# Patient Record
Sex: Female | Born: 1950 | Race: Black or African American | Hispanic: No | Smoking: Never smoker
Health system: Southern US, Community
[De-identification: ages and names within clinical notes are randomized; demographics above are authoritative.]

## PROBLEM LIST (undated history)

## (undated) DIAGNOSIS — E119 Type 2 diabetes mellitus without complications: Secondary | ICD-10-CM

## (undated) DIAGNOSIS — H269 Unspecified cataract: Secondary | ICD-10-CM

## (undated) DIAGNOSIS — I10 Essential (primary) hypertension: Secondary | ICD-10-CM

## (undated) DIAGNOSIS — H35039 Hypertensive retinopathy, unspecified eye: Secondary | ICD-10-CM

## (undated) DIAGNOSIS — E11319 Type 2 diabetes mellitus with unspecified diabetic retinopathy without macular edema: Secondary | ICD-10-CM

## (undated) HISTORY — DX: Hypertensive retinopathy, unspecified eye: H35.039

## (undated) HISTORY — PX: HYSTERECTOMY ABDOMINAL WITH SALPINGECTOMY: SHX6725

## (undated) HISTORY — DX: Type 2 diabetes mellitus with unspecified diabetic retinopathy without macular edema: E11.319

## (undated) HISTORY — DX: Unspecified cataract: H26.9

---

## 1999-06-14 ENCOUNTER — Encounter: Payer: Self-pay | Admitting: Obstetrics

## 1999-06-14 ENCOUNTER — Encounter: Admission: RE | Admit: 1999-06-14 | Discharge: 1999-06-14 | Payer: Self-pay | Admitting: Obstetrics

## 2000-06-14 ENCOUNTER — Encounter: Payer: Self-pay | Admitting: Obstetrics

## 2000-06-14 ENCOUNTER — Encounter: Admission: RE | Admit: 2000-06-14 | Discharge: 2000-06-14 | Payer: Self-pay | Admitting: Obstetrics

## 2001-06-16 ENCOUNTER — Encounter: Admission: RE | Admit: 2001-06-16 | Discharge: 2001-06-16 | Payer: Self-pay | Admitting: Obstetrics

## 2001-06-16 ENCOUNTER — Encounter: Payer: Self-pay | Admitting: Obstetrics

## 2001-12-10 ENCOUNTER — Encounter: Admission: RE | Admit: 2001-12-10 | Discharge: 2002-03-10 | Payer: Self-pay | Admitting: Family Medicine

## 2002-06-22 ENCOUNTER — Encounter: Payer: Self-pay | Admitting: Obstetrics

## 2002-06-22 ENCOUNTER — Encounter: Admission: RE | Admit: 2002-06-22 | Discharge: 2002-06-22 | Payer: Self-pay | Admitting: Obstetrics

## 2003-07-01 ENCOUNTER — Encounter: Admission: RE | Admit: 2003-07-01 | Discharge: 2003-07-01 | Payer: Self-pay | Admitting: Obstetrics

## 2004-08-15 ENCOUNTER — Encounter: Admission: RE | Admit: 2004-08-15 | Discharge: 2004-08-15 | Payer: Self-pay | Admitting: Obstetrics

## 2005-08-17 ENCOUNTER — Encounter: Admission: RE | Admit: 2005-08-17 | Discharge: 2005-08-17 | Payer: Self-pay | Admitting: Obstetrics

## 2006-08-19 ENCOUNTER — Encounter: Admission: RE | Admit: 2006-08-19 | Discharge: 2006-08-19 | Payer: Self-pay | Admitting: Obstetrics

## 2007-08-21 ENCOUNTER — Encounter: Admission: RE | Admit: 2007-08-21 | Discharge: 2007-08-21 | Payer: Self-pay | Admitting: Obstetrics

## 2009-01-13 ENCOUNTER — Encounter: Admission: RE | Admit: 2009-01-13 | Discharge: 2009-01-13 | Payer: Self-pay | Admitting: Obstetrics

## 2009-02-25 ENCOUNTER — Encounter: Admission: RE | Admit: 2009-02-25 | Discharge: 2009-02-25 | Payer: Self-pay | Admitting: Family Medicine

## 2010-03-02 ENCOUNTER — Encounter: Admission: RE | Admit: 2010-03-02 | Discharge: 2010-03-02 | Payer: Self-pay | Admitting: Obstetrics

## 2011-05-04 ENCOUNTER — Other Ambulatory Visit: Payer: Self-pay | Admitting: Obstetrics

## 2011-05-04 DIAGNOSIS — Z1231 Encounter for screening mammogram for malignant neoplasm of breast: Secondary | ICD-10-CM

## 2011-06-07 ENCOUNTER — Ambulatory Visit
Admission: RE | Admit: 2011-06-07 | Discharge: 2011-06-07 | Disposition: A | Discharge status: Home / Self Care | Payer: BC Managed Care – PPO | Source: Ambulatory Visit | Attending: Obstetrics | Admitting: Obstetrics

## 2011-06-07 DIAGNOSIS — Z1231 Encounter for screening mammogram for malignant neoplasm of breast: Secondary | ICD-10-CM

## 2012-05-28 ENCOUNTER — Other Ambulatory Visit: Payer: Self-pay | Admitting: Obstetrics

## 2012-05-28 DIAGNOSIS — Z1231 Encounter for screening mammogram for malignant neoplasm of breast: Secondary | ICD-10-CM

## 2012-07-08 ENCOUNTER — Ambulatory Visit
Admission: RE | Admit: 2012-07-08 | Discharge: 2012-07-08 | Disposition: A | Discharge status: Home / Self Care | Payer: BC Managed Care – PPO | Source: Ambulatory Visit | Attending: Obstetrics | Admitting: Obstetrics

## 2012-07-08 DIAGNOSIS — Z1231 Encounter for screening mammogram for malignant neoplasm of breast: Secondary | ICD-10-CM

## 2012-07-11 ENCOUNTER — Other Ambulatory Visit: Payer: Self-pay | Admitting: Family Medicine

## 2012-07-11 DIAGNOSIS — R1031 Right lower quadrant pain: Secondary | ICD-10-CM

## 2012-07-16 ENCOUNTER — Ambulatory Visit
Admission: RE | Admit: 2012-07-16 | Discharge: 2012-07-16 | Disposition: A | Discharge status: Home / Self Care | Payer: BC Managed Care – PPO | Source: Ambulatory Visit | Attending: Family Medicine | Admitting: Family Medicine

## 2012-07-16 DIAGNOSIS — R1031 Right lower quadrant pain: Secondary | ICD-10-CM

## 2012-07-16 MED ORDER — IOHEXOL 300 MG/ML  SOLN
100.0000 mL | Freq: Once | INTRAMUSCULAR | Status: AC | PRN
  Administered 2012-07-16: 100 mL via INTRAVENOUS

## 2013-06-25 HISTORY — PX: EYE SURGERY: SHX253

## 2013-07-03 ENCOUNTER — Other Ambulatory Visit: Payer: Self-pay

## 2013-07-03 DIAGNOSIS — Z1231 Encounter for screening mammogram for malignant neoplasm of breast: Secondary | ICD-10-CM

## 2013-07-24 ENCOUNTER — Ambulatory Visit
Admission: RE | Admit: 2013-07-24 | Discharge: 2013-07-24 | Disposition: A | Discharge status: Home / Self Care | Payer: BC Managed Care – PPO | Source: Ambulatory Visit

## 2013-07-24 DIAGNOSIS — Z1231 Encounter for screening mammogram for malignant neoplasm of breast: Secondary | ICD-10-CM

## 2014-11-23 ENCOUNTER — Other Ambulatory Visit: Payer: Self-pay

## 2014-11-23 DIAGNOSIS — Z1231 Encounter for screening mammogram for malignant neoplasm of breast: Secondary | ICD-10-CM

## 2014-12-22 ENCOUNTER — Ambulatory Visit
Admission: RE | Admit: 2014-12-22 | Discharge: 2014-12-22 | Disposition: A | Discharge status: Home / Self Care | Payer: Medicare Other | Source: Ambulatory Visit

## 2014-12-22 DIAGNOSIS — Z1231 Encounter for screening mammogram for malignant neoplasm of breast: Secondary | ICD-10-CM

## 2016-02-01 ENCOUNTER — Other Ambulatory Visit: Payer: Self-pay | Admitting: Internal Medicine

## 2016-02-01 ENCOUNTER — Other Ambulatory Visit: Payer: Self-pay | Admitting: Family Medicine

## 2016-02-01 DIAGNOSIS — Z1231 Encounter for screening mammogram for malignant neoplasm of breast: Secondary | ICD-10-CM

## 2016-03-01 ENCOUNTER — Ambulatory Visit
Admission: RE | Admit: 2016-03-01 | Discharge: 2016-03-01 | Disposition: A | Discharge status: Home / Self Care | Payer: Medicare Other | Source: Ambulatory Visit | Attending: Family Medicine | Admitting: Family Medicine

## 2016-03-01 DIAGNOSIS — Z1231 Encounter for screening mammogram for malignant neoplasm of breast: Secondary | ICD-10-CM

## 2016-09-27 DIAGNOSIS — E1165 Type 2 diabetes mellitus with hyperglycemia: Secondary | ICD-10-CM | POA: Diagnosis not present

## 2016-10-04 DIAGNOSIS — I10 Essential (primary) hypertension: Secondary | ICD-10-CM | POA: Diagnosis not present

## 2016-10-04 DIAGNOSIS — E669 Obesity, unspecified: Secondary | ICD-10-CM | POA: Diagnosis not present

## 2016-10-04 DIAGNOSIS — E1165 Type 2 diabetes mellitus with hyperglycemia: Secondary | ICD-10-CM | POA: Diagnosis not present

## 2016-10-04 DIAGNOSIS — E7800 Pure hypercholesterolemia, unspecified: Secondary | ICD-10-CM | POA: Diagnosis not present

## 2016-10-10 DIAGNOSIS — E113213 Type 2 diabetes mellitus with mild nonproliferative diabetic retinopathy with macular edema, bilateral: Secondary | ICD-10-CM | POA: Diagnosis not present

## 2016-10-10 DIAGNOSIS — H2513 Age-related nuclear cataract, bilateral: Secondary | ICD-10-CM | POA: Diagnosis not present

## 2016-10-22 DIAGNOSIS — E7800 Pure hypercholesterolemia, unspecified: Secondary | ICD-10-CM | POA: Diagnosis not present

## 2016-10-22 DIAGNOSIS — Z79899 Other long term (current) drug therapy: Secondary | ICD-10-CM | POA: Diagnosis not present

## 2016-10-22 DIAGNOSIS — E1165 Type 2 diabetes mellitus with hyperglycemia: Secondary | ICD-10-CM | POA: Diagnosis not present

## 2016-10-22 DIAGNOSIS — Z794 Long term (current) use of insulin: Secondary | ICD-10-CM | POA: Diagnosis not present

## 2016-10-22 DIAGNOSIS — I10 Essential (primary) hypertension: Secondary | ICD-10-CM | POA: Diagnosis not present

## 2016-10-22 DIAGNOSIS — Z0001 Encounter for general adult medical examination with abnormal findings: Secondary | ICD-10-CM | POA: Diagnosis not present

## 2016-10-22 DIAGNOSIS — Z6841 Body Mass Index (BMI) 40.0 and over, adult: Secondary | ICD-10-CM | POA: Diagnosis not present

## 2017-04-01 DIAGNOSIS — E1165 Type 2 diabetes mellitus with hyperglycemia: Secondary | ICD-10-CM | POA: Diagnosis not present

## 2017-04-01 DIAGNOSIS — E7800 Pure hypercholesterolemia, unspecified: Secondary | ICD-10-CM | POA: Diagnosis not present

## 2017-04-01 DIAGNOSIS — E669 Obesity, unspecified: Secondary | ICD-10-CM | POA: Diagnosis not present

## 2017-04-05 DIAGNOSIS — M62830 Muscle spasm of back: Secondary | ICD-10-CM | POA: Diagnosis not present

## 2017-04-08 DIAGNOSIS — I10 Essential (primary) hypertension: Secondary | ICD-10-CM | POA: Diagnosis not present

## 2017-04-08 DIAGNOSIS — E669 Obesity, unspecified: Secondary | ICD-10-CM | POA: Diagnosis not present

## 2017-04-08 DIAGNOSIS — E1165 Type 2 diabetes mellitus with hyperglycemia: Secondary | ICD-10-CM | POA: Diagnosis not present

## 2017-04-08 DIAGNOSIS — E7800 Pure hypercholesterolemia, unspecified: Secondary | ICD-10-CM | POA: Diagnosis not present

## 2017-04-17 DIAGNOSIS — E113313 Type 2 diabetes mellitus with moderate nonproliferative diabetic retinopathy with macular edema, bilateral: Secondary | ICD-10-CM | POA: Diagnosis not present

## 2017-04-17 DIAGNOSIS — H2513 Age-related nuclear cataract, bilateral: Secondary | ICD-10-CM | POA: Diagnosis not present

## 2017-04-19 NOTE — Progress Notes (Signed)
[facility] Note  04/22/2017     CHIEF COMPLAINT Patient presents for Retina Evaluation   HISTORY OF PRESENT ILLNESS: Kimberly Hammond is a 66 y.o. female who presents to the clinic today for:   HPI    Retina Evaluation  In both eyes.  Associated Symptoms Photophobia.  Negative for Floaters, Pain, Glare, Distortion, Redness, Trauma, Shoulder/Hip pain, Fatigue, Weight Loss, Jaw Claudication, Fever, Scalp Tenderness, Blind Spot and Flashes.  Context:  distance vision, mid-range vision and near vision.  Treatments tried include no treatments.  I, the attending physician,  performed the HPI with the patient and updated documentation appropriately.        Comments  Referral of DR. Groat Eval Mac Edema OS . Patient states she has noticed any issues with her eyes. She occasionally has light sensitivity when she first goes outside. Denies floaters, flashes and pain. She denies using eye gtts. She does take a multivitamin occasionally. She is a diabetic she takes glipizide and metformin . She states her CBG are within normal limits most of the time. Last CBG 102 a week ago and  A1C 7.8    Pt states that she was seen by Dr. Katy Fitch; states that she      Last edited by Alyse Low on 04/22/2017  9:17 AM. (History)      Referring physician: Warden Fillers, MD [address] [place], [place] [id number]  HISTORICAL INFORMATION:   Selected notes from the MEDICAL RECORD NUMBER Referred form Dr. Shirleen Schirmer for concern of macular edema OS;  Ocular Hx- NPDR OU; cataract OU;  PMH- Type 2 DM; HTN   CURRENT MEDICATIONS: No current outpatient prescriptions on file. (Ophthalmic Drugs)   No current facility-administered medications for this visit.  (Ophthalmic Drugs)   Current Outpatient Prescriptions (Other)  Medication Sig  . aspirin 81 MG chewable tablet Chew 81 mg by mouth daily.  Marland Kitchen glipiZIDE (GLUCOTROL) 5 MG tablet Take by mouth 2 (two) times daily  before a meal.  . insulin glargine (LANTUS) 100 UNIT/ML injection Inject 24 Units into the skin at bedtime.  Marland Kitchen lisinopril-hydrochlorothiazide (PRINZIDE,ZESTORETIC) 20-25 MG tablet Take 1 tablet by mouth daily.  . metFORMIN (GLUCOPHAGE) 500 MG tablet Take by mouth 2 (two) times daily with a meal.  . metoprolol succinate (TOPROL-XL) 25 MG 24 hr tablet Take 25 mg by mouth daily.  . mupirocin ointment (BACTROBAN) 2 % Apply 1 application topically 2 (two) times daily.  Marland Kitchen RELION INSULIN SYR 0.5ML/31G 31G X 5/16" 0.5 ML MISC    No current facility-administered medications for this visit.  (Other)      REVIEW OF SYSTEMS: ROS    Positive for: Endocrine, Eyes   Negative for: Constitutional, Gastrointestinal, Neurological, Skin, Genitourinary, Musculoskeletal, HENT, Cardiovascular, Respiratory, Psychiatric, Allergic/Imm, Heme/Lymph   Last edited by Zenovia Jordan, LPN on 99/37/1696  7:89 AM. (History)       ALLERGIES No Known Allergies  PAST MEDICAL HISTORY Past Medical History:  Diagnosis Date  . Diabetes mellitus without complication ([place])   . Hypertension    Past Surgical History:  Procedure Laterality Date  . EYE SURGERY  2015   OS/OD  . HYSTERECTOMY ABDOMINAL WITH SALPINGECTOMY      FAMILY HISTORY Family History  Problem Relation Age of Onset  . Hypertension Mother   . Diabetes Maternal Aunt     SOCIAL HISTORY Social History  Substance Use Topics  . Smoking status: Never Smoker  . Smokeless tobacco:  Never Used  . Alcohol use No         OPHTHALMIC EXAM:  Base Eye Exam    Visual Acuity (Snellen - Linear)      Right Left   Dist cc 20/40 -1 20/25 -2   Dist ph cc 20/30 -1 20/20 -1   Correction:  Glasses       Tonometry (Tonopen, 8:56 AM)      Right Left   Pressure 16 13       Pupils      Dark Light Shape React APD   Right 4 3 Round 2 None   Left 4 3 Round 2 None       Visual Fields      Left Right    Full Full       Extraocular Movement       Right Left    Full, Nystagmus Full, Nystagmus       Neuro/Psych    Oriented x3:  Yes   Mood/Affect:  Normal       Dilation    Both eyes:  1.0% Mydriacyl, 2.5% Phenylephrine @ 8:57 AM        Slit Lamp and Fundus Exam    Slit Lamp Exam      Right Left   Lids/Lashes Dermatochalasis - upper lid Dermatochalasis - upper lid   Conjunctiva/Sclera White and quiet White and quiet   Cornea Trace Punctate epithelial erosions, Arcus Trace Punctate epithelial erosions, Arcus   Anterior Chamber Deep and quiet Deep and quiet   Iris Round and dilated, No NVI Round and dilated, No NVI   Lens 2+ Nuclear sclerosis, 2+ Cortical cataract, Vacuoles 2+ Nuclear sclerosis, 2+ Cortical cataract, Vacuoles   Vitreous Vitreous syneresis Vitreous syneresis       Fundus Exam      Right Left   Disc Normal, No NVD Normal, No NVD   C/D Ratio 0.3 0.3   Macula good foveal relfex, Retinal pigment epithelial mottling, Temporal Microaneurysms - mild, Exudates, Blot hemorrhage blunted foveal relfex, perifoveal exudates extending temporally, Microaneurysms   Vessels Mild copper wiring Mild copper wiring   Periphery Attached, Rare MA, Temporal Early Cotton wool spots Attached, Rare MA, Temporal Early Cotton wool spots        Refraction    Wearing Rx      Sphere Cylinder Axis Add   Right -0.75 +0.75 160 +2.75   Left -1.00 +0.75 025 +2.75   Type:  PAL       Manifest Refraction (Over)      Sphere Cylinder Axis Dist VA   Right -1.00 +0.75 160 20/30-1   Left -1.00 +0.75 018 20/25          IMAGING AND PROCEDURES  Imaging and Procedures for 04/22/17  OCT, Retina - OU - Both Eyes     Right Eye Quality was good. Central Foveal Thickness: 222. Progression has no prior data. Findings include normal foveal contour, no SRF, vitreomacular adhesion , intraretinal fluid (Non central DME / IRF temporal to fovea caught on wide field OCT).   Left Eye Quality was good. Central Foveal Thickness: 227. Progression has  no prior data. Findings include normal foveal contour, no SRF, intraretinal fluid, vitreomacular adhesion  (DME / IRF just inferotemporal to fovea).   Notes Images taken, stored on drive  Diagnosis / Impression:  DME OU, OS>OD  Clinical management:  See below  Abbreviations: NFP - Normal foveal profile. CME - cystoid macular edema. PED - pigment epithelial  detachment. IRF - intraretinal fluid. SRF - subretinal fluid. EZ - ellipsoid zone. ERM - epiretinal membrane. ORA - outer retinal atrophy. ORT - outer retinal tubulation. SRHM - subretinal hyper-reflective material       Fluorescein Angiography Optos (Transit OS)     Right Eye Progression has no prior data. Early phase findings include microaneurysm. Mid/Late phase findings include microaneurysm, leakage.   Left Eye Progression has no prior data. Early phase findings include microaneurysm. Mid/Late phase findings include microaneurysm, leakage.   Notes Borderline image quality with decreased fluorescent signal. Patient compliance low/moderate.    Right Eye:  Findings: microaneurysm temporal to fovea w/ late leakage Comparison to previous: not applicable   Left Eye:  Findings: microaneurysms inf temp to fovea w/ late leakge Comparison to previous: not applicable   Diagnosis / Impression:  MAs w/ late leakage corresponding to areas of DME; low signal study;              ASSESSMENT/PLAN:    ICD-10-CM   1. Moderate nonproliferative diabetic retinopathy of both eyes with macular edema associated with type 2 diabetes mellitus (HCC) N81.7711 OCT, Retina - OU - Both Eyes    Fluorescein Angiography Optos (Transit OS)  2. Hypertensive retinopathy of both eyes H35.033   3. Nuclear sclerosis of both eyes H25.13     1. Moderate non-proliferative diabetic retinopathy, both eyes - The incidence, risk factors for progression, natural history and treatment options for diabetic retinopathy  were discussed with patient.   - The  need for close monitoring of blood glucose, blood pressure, and serum lipids, avoiding cigarette or any type of tobacco, and the need for long term follow up was also discussed with patient. - no NV noted on exam or FA - Diabetic macular edema, both eyes   - The natural history, pathology, and characteristics of diabetic macular edema discussed with patient.  A generalized discussion of the major clinical trials concerning treatment of diabetic macular edema (ETDRS, DCT, SCORE, RISE / RIDE, and ongoing DRCR net studies) was completed.    - This discussion included mention of the various approaches to treating diabetic macular edema (observation, laser photocoagulation, anti-VEGF injections with lucentis / Avastin / Eylea, steroid injections with Kenalog / Ozurdex, and intraocular surgery with vitrectomy).    - The goal hemoglobin A1C of 6-7 was discussed, as well as importance of smoking cessation and hypertension control.    - Need for ongoing treatment and monitoring were specifically discussed with reference to chronic nature of diabetic macular edema.   - mild DME today with no significant impact on fovea and VA at this time -- BCVA remains 20/30 and 20/20  - recommend monitoring for now -- pt agrees with plan of care  - f/u in 6 wks with repeat FA -- possibly [place] -- pt with difficulty today in Optos   2. Hypertensive retinopathy OU - discussed importance of tight BP control - monitor  3. Nuclear sclerosis OU - The symptoms of cataract, surgical options, and treatments and risks were discussed with patient. - discussed diagnosis and progression - under the expert care of Dr. Shirleen Schirmer   Ophthalmic Meds Ordered this visit:  No orders of the defined types were placed in this encounter.      Return in about 7 weeks (around 06/10/2017) for F/U DME OU.  There are no Patient Instructions on file for this visit.   Explained the diagnoses, plan, and follow up with the patient and  they expressed  understanding.  Patient expressed understanding of the importance of proper follow up care.   Gardiner Sleeper, M.D., Ph.D. Diseases & Surgery of the Retina and Vitreous [facility] 04/22/17     Abbreviations: M myopia (nearsighted); A astigmatism; H hyperopia (farsighted); P presbyopia; Mrx spectacle prescription;  CTL contact lenses; OD right eye; OS left eye; OU both eyes  XT exotropia; ET esotropia; PEK punctate epithelial keratitis; PEE punctate epithelial erosions; DES dry eye syndrome; MGD meibomian gland dysfunction; ATs artificial tears; PFAT's preservative free artificial tears; [place] nuclear sclerotic cataract; PSC posterior subcapsular cataract; ERM epi-retinal membrane; PVD posterior vitreous detachment; RD retinal detachment; DM diabetes mellitus; DR diabetic retinopathy; NPDR non-proliferative diabetic retinopathy; PDR proliferative diabetic retinopathy; CSME clinically significant macular edema; DME diabetic macular edema; dbh dot blot hemorrhages; CWS cotton wool spot; POAG primary open angle glaucoma; C/D cup-to-disc ratio; HVF humphrey visual field; GVF goldmann visual field; OCT optical coherence tomography; IOP intraocular pressure; BRVO Branch retinal vein occlusion; CRVO central retinal vein occlusion; CRAO central retinal artery occlusion; BRAO branch retinal artery occlusion; RT retinal tear; SB scleral buckle; PPV pars plana vitrectomy; VH Vitreous hemorrhage; PRP panretinal laser photocoagulation; IVK intravitreal kenalog; VMT vitreomacular traction; MH Macular hole;  NVD neovascularization of the disc; NVE neovascularization elsewhere; AREDS age related eye disease study; ARMD age related macular degeneration; POAG primary open angle glaucoma; EBMD epithelial/anterior basement membrane dystrophy; ACIOL anterior chamber intraocular lens; IOL intraocular lens; PCIOL posterior chamber intraocular lens; Phaco/IOL phacoemulsification with  intraocular lens placement; [place] photorefractive keratectomy; LASIK laser assisted in situ keratomileusis; HTN hypertension; DM diabetes mellitus; COPD chronic obstructive pulmonary disease

## 2017-04-21 ENCOUNTER — Ambulatory Visit (HOSPITAL_COMMUNITY)
Admission: EM | Admit: 2017-04-21 | Discharge: 2017-04-21 | Disposition: A | Discharge status: Home / Self Care | Payer: PPO | Attending: Internal Medicine | Admitting: Internal Medicine

## 2017-04-21 ENCOUNTER — Encounter (HOSPITAL_COMMUNITY): Payer: Self-pay | Admitting: Emergency Medicine

## 2017-04-21 DIAGNOSIS — M795 Residual foreign body in soft tissue: Secondary | ICD-10-CM

## 2017-04-21 HISTORY — DX: Essential (primary) hypertension: I10

## 2017-04-21 HISTORY — DX: Type 2 diabetes mellitus without complications: E11.9

## 2017-04-21 MED ORDER — MUPIROCIN 2 % EX OINT: 1.0000 | TOPICAL_OINTMENT | Freq: Two times a day (BID) | CUTANEOUS | 0 refills | Status: DC

## 2017-04-21 NOTE — ED Provider Notes (Signed)
[facility]    CSN: [id number] Arrival date & time: 04/21/17  1209     History   Chief Complaint Chief Complaint  Patient presents with  . Foreign Body in Skin    HPI Kimberly Hammond is a 67 y.o. female.   66 year old female with history of HTN, DM comes in for foreign body in the right heel. Patient states she felt a sharp pain over her sock last night and noticed some broken lead from a pencil in her sock. She has a wound on her right heel with discoloration and believes some of the lead went through the skin. She has not done anything for it. Denies pain, spreading redness, increased warmth, increased pain. She states she gets yearly foot exams from PCP and does not have any neuropathy. She has not seen a podiatrist before. History of DM that is stable, last a1c 7.8, followed by [facility] physician. She states she is up to date on tetanus, but does not recall exactly when last tetanus injection was, stating she gets them every 10 years. She has an appointment with PCP in 4 days.       Past Medical History:  Diagnosis Date  . Diabetes mellitus without complication ([place])   . Hypertension     There are no active problems to display for this patient.   History reviewed. No pertinent surgical history.  OB History    No data available       Home Medications    Prior to Admission medications   Medication Sig Start Date End Date Taking? Authorizing Provider  aspirin 81 MG chewable tablet Chew 81 mg by mouth daily.   Yes [provider]  glipiZIDE (GLUCOTROL) 5 MG tablet Take by mouth 2 (two) times daily before a meal.   Yes [provider]  insulin glargine (LANTUS) 100 UNIT/ML injection Inject 24 Units into the skin at bedtime.   Yes [provider]  lisinopril-hydrochlorothiazide (PRINZIDE,ZESTORETIC) 20-25 MG tablet Take 1 tablet by mouth daily.   Yes [provider]  metFORMIN (GLUCOPHAGE) 500 MG tablet Take by mouth 2  (two) times daily with a meal.   Yes [provider]  metoprolol succinate (TOPROL-XL) 25 MG 24 hr tablet Take 25 mg by mouth daily.   Yes [provider]  mupirocin ointment (BACTROBAN) 2 % Apply 1 application topically 2 (two) times daily. 04/21/17   Ok Edwards, PA-C    Family History History reviewed. No pertinent family history.  Social History Social History  Substance Use Topics  . Smoking status: Never Smoker  . Smokeless tobacco: Never Used  . Alcohol use No     Allergies   Patient has no known allergies.   Review of Systems Review of Systems  Reason unable to perform ROS: See HPI as above.     Physical Exam Triage Vital Signs ED Triage Vitals [04/21/17 1307]  Enc Vitals Group     BP 135/86     Pulse Rate 71     Resp 18     Temp 98.1 F (36.7 C)     Temp Source Oral     SpO2 98 %     Weight      Height      Head Circumference      Peak Flow      Pain Score      Pain Loc      Pain Edu?      Excl. in  GC?    No data found.   Updated Vital Signs BP 135/86 (BP Location: Right Arm)   Pulse 71   Temp 98.1 F (36.7 C) (Oral)   Resp 18   SpO2 98%    Physical Exam  Constitutional: She is oriented to person, place, and time. She appears well-developed and well-nourished. No distress.  HENT:  Head: Normocephalic and atraumatic.  Eyes: Pupils are equal, round, and reactive to light. Conjunctivae are normal.  Neurological: She is alert and oriented to person, place, and time.  Skin:  Small wound on right heel. Black coloration of lead pencil seen, unable to feel foreign body.      UC Treatments / Results  Labs (all labs ordered are listed, but only abnormal results are displayed) Labs Reviewed - No data to display  EKG  EKG Interpretation None       Radiology No results found.  Procedures .Foreign Body Removal Date/Time: 04/21/2017 1:54 PM Performed by: Cathlean Sauer V Authorized by: Sherlene Shams  Consent: Verbal  consent obtained. Risks and benefits: risks, benefits and alternatives were discussed Consent given by: patient Patient identity confirmed: verbally with patient Body area: skin General location: lower extremity Location details: right foot  Anesthesia: Local Anesthetic: lidocaine 2% with epinephrine Localization method: visualized Removal mechanism: hemostat Dressing: antibiotic ointment and dressing applied Tendon involvement: none Complexity: simple 1 objects recovered. Objects recovered: pencil lead Post-procedure assessment: foreign body removed Patient tolerance: Patient tolerated the procedure well with no immediate complications   (including critical care time)  Medications Ordered in UC Medications - No data to display   Initial Impression / Assessment and Plan / UC Course  I have reviewed the triage vital signs and the nursing notes.  Pertinent labs & imaging results that were available during my care of the patient were reviewed by me and considered in my medical decision making (see chart for details).    Foreign body removed. Bactroban as directed. Daily dressing. Follow up with PCP as scheduled for wound recheck. Return precautions given.   Final Clinical Impressions(s) / UC Diagnoses   Final diagnoses:  Foreign body (FB) in soft tissue    New Prescriptions Discharge Medication List as of 04/21/2017  1:48 PM    START taking these medications   Details  mupirocin ointment (BACTROBAN) 2 % Apply 1 application topically 2 (two) times daily., Starting Sun 04/21/2017, Normal         Tasia Catchings, Jillianne Gamino V, PA-C 04/21/17 1355

## 2017-04-21 NOTE — ED Triage Notes (Signed)
Pt sts stepped on lead from pencil  Last night and some went into her heal; pt denies pain but has hx of DM

## 2017-04-21 NOTE — Discharge Instructions (Signed)
Foreign body removed today. Use bactroban on incision site. Daily dressing. Double check with PCP on tetanus status and whether or not it needs to be updated. If experiencing worsening symptoms, spreading redness, increased warmth, drainage, fever, follow up for reevaluation.

## 2017-04-22 ENCOUNTER — Encounter (INDEPENDENT_AMBULATORY_CARE_PROVIDER_SITE_OTHER): Payer: Self-pay | Admitting: Ophthalmology

## 2017-04-22 ENCOUNTER — Ambulatory Visit (INDEPENDENT_AMBULATORY_CARE_PROVIDER_SITE_OTHER): Payer: PPO | Admitting: Ophthalmology

## 2017-04-22 DIAGNOSIS — H35033 Hypertensive retinopathy, bilateral: Secondary | ICD-10-CM | POA: Diagnosis not present

## 2017-04-22 DIAGNOSIS — E113313 Type 2 diabetes mellitus with moderate nonproliferative diabetic retinopathy with macular edema, bilateral: Secondary | ICD-10-CM | POA: Diagnosis not present

## 2017-04-22 DIAGNOSIS — H2513 Age-related nuclear cataract, bilateral: Secondary | ICD-10-CM

## 2017-04-25 DIAGNOSIS — E7800 Pure hypercholesterolemia, unspecified: Secondary | ICD-10-CM | POA: Diagnosis not present

## 2017-04-25 DIAGNOSIS — E1165 Type 2 diabetes mellitus with hyperglycemia: Secondary | ICD-10-CM | POA: Diagnosis not present

## 2017-04-25 DIAGNOSIS — Z23 Encounter for immunization: Secondary | ICD-10-CM | POA: Diagnosis not present

## 2017-04-25 DIAGNOSIS — I10 Essential (primary) hypertension: Secondary | ICD-10-CM | POA: Diagnosis not present

## 2017-05-08 ENCOUNTER — Other Ambulatory Visit: Payer: Self-pay | Admitting: Family Medicine

## 2017-05-08 DIAGNOSIS — Z1231 Encounter for screening mammogram for malignant neoplasm of breast: Secondary | ICD-10-CM

## 2017-05-11 ENCOUNTER — Emergency Department (HOSPITAL_COMMUNITY)
Admission: EM | Admit: 2017-05-11 | Discharge: 2017-05-11 | Disposition: A | Discharge status: Home / Self Care | Payer: PPO | Attending: Emergency Medicine | Admitting: Emergency Medicine

## 2017-05-11 ENCOUNTER — Emergency Department (HOSPITAL_COMMUNITY): Payer: PPO

## 2017-05-11 ENCOUNTER — Encounter (HOSPITAL_COMMUNITY): Payer: Self-pay | Admitting: Emergency Medicine

## 2017-05-11 ENCOUNTER — Other Ambulatory Visit: Payer: Self-pay

## 2017-05-11 DIAGNOSIS — Z794 Long term (current) use of insulin: Secondary | ICD-10-CM | POA: Insufficient documentation

## 2017-05-11 DIAGNOSIS — I10 Essential (primary) hypertension: Secondary | ICD-10-CM | POA: Insufficient documentation

## 2017-05-11 DIAGNOSIS — R002 Palpitations: Secondary | ICD-10-CM | POA: Insufficient documentation

## 2017-05-11 DIAGNOSIS — E119 Type 2 diabetes mellitus without complications: Secondary | ICD-10-CM | POA: Insufficient documentation

## 2017-05-11 DIAGNOSIS — Z79899 Other long term (current) drug therapy: Secondary | ICD-10-CM | POA: Diagnosis not present

## 2017-05-11 DIAGNOSIS — Z7982 Long term (current) use of aspirin: Secondary | ICD-10-CM | POA: Diagnosis not present

## 2017-05-11 DIAGNOSIS — R05 Cough: Secondary | ICD-10-CM | POA: Diagnosis not present

## 2017-05-11 LAB — CBC
HCT: 35.5 % — ABNORMAL LOW (ref 36.0–46.0)
Hemoglobin: 11.3 g/dL — ABNORMAL LOW (ref 12.0–15.0)
MCH: 22.5 pg — ABNORMAL LOW (ref 26.0–34.0)
MCHC: 31.8 g/dL (ref 30.0–36.0)
MCV: 70.6 fL — ABNORMAL LOW (ref 78.0–100.0)
Platelets: 231 10*3/uL (ref 150–400)
RBC: 5.03 MIL/uL (ref 3.87–5.11)
RDW: 13.6 % (ref 11.5–15.5)
WBC: 4.9 10*3/uL (ref 4.0–10.5)

## 2017-05-11 LAB — BASIC METABOLIC PANEL
Anion gap: 6 (ref 5–15)
BUN: 17 mg/dL (ref 6–20)
CO2: 24 mmol/L (ref 22–32)
Calcium: 8.9 mg/dL (ref 8.9–10.3)
Chloride: 105 mmol/L (ref 101–111)
Creatinine, Ser: 0.62 mg/dL (ref 0.44–1.00)
GFR calc Af Amer: >60 mL/min (ref >60)
GFR calc non Af Amer: >60 mL/min (ref >60)
Glucose, Bld: 135 mg/dL — ABNORMAL HIGH (ref 65–99)
Potassium: 3.8 mmol/L (ref 3.5–5.1)
Sodium: 135 mmol/L (ref 135–145)

## 2017-05-11 LAB — I-STAT TROPONIN, ED: Troponin i, poc: 0 ng/mL (ref 0.00–0.08)

## 2017-05-11 NOTE — Discharge Instructions (Signed)
As discussed, your evaluation today has been largely reassuring.  But, it is important that you monitor your condition carefully, and do not hesitate to return to the ED if you develop new, or concerning changes in your condition. ? ?Otherwise, please follow-up with your physician for appropriate ongoing care. ? ?

## 2017-05-11 NOTE — ED Triage Notes (Signed)
PT. Stated, Thursday night I felt like something was not right with the right side of my heart and it was skipping a beat.

## 2017-05-11 NOTE — ED Provider Notes (Signed)
[facility] Provider Note   CSN: [id number] Arrival date & time: 05/11/17  1042     History   Chief Complaint Chief Complaint  Patient presents with  . Palpitations    HPI Kimberly Hammond is a 66 y.o. female.  HPI  Patient presents with concern of palpitations. She notes that she has had episodes going back for some time, but over the past few days has had increasing frequency of palpitations, without chest pain, without lightheadedness, without syncope, without nausea, without vomiting, without abdominal pain. No recent medication changes, diet changes, activity changes. No history of heart disease, but she does have a history of hypertension and diabetes.  Past Medical History:  Diagnosis Date  . Diabetes mellitus without complication (HCC)   . Hypertension     There are no active problems to display for this patient.   Past Surgical History:  Procedure Laterality Date  . EYE SURGERY  2015   OS/OD  . HYSTERECTOMY ABDOMINAL WITH SALPINGECTOMY      OB History    No data available       Home Medications    Prior to Admission medications   Medication Sig Start Date End Date Taking? Authorizing Provider  aspirin 81 MG chewable tablet Chew 81 mg by mouth daily.   Yes [provider]  glipiZIDE (GLUCOTROL) 5 MG tablet Take by mouth 2 (two) times daily before a meal.   Yes [provider]  insulin glargine (LANTUS) 100 UNIT/ML injection Inject 24 Units into the skin at bedtime.   Yes [provider]  lisinopril-hydrochlorothiazide (PRINZIDE,ZESTORETIC) 20-25 MG tablet Take 1 tablet by mouth daily.   Yes [provider]  metFORMIN (GLUCOPHAGE) 500 MG tablet Take 500 mg 2 (two) times daily with a meal by mouth.    Yes [provider]  metoprolol succinate (TOPROL-XL) 25 MG 24 hr tablet Take 25 mg by mouth daily.   Yes [provider]  mupirocin ointment (BACTROBAN) 2 % Apply  1 application topically 2 (two) times daily. 04/21/17  Yes Yu, Amy V, PA-C  naproxen (NAPROSYN) 500 MG tablet Take 500 mg as needed by mouth for pain. 04/05/17  Yes [provider]    Family History Family History  Problem Relation Age of Onset  . Hypertension Mother   . Diabetes Maternal Aunt     Social History Social History   Tobacco Use  . Smoking status: Never Smoker  . Smokeless tobacco: Never Used  Substance Use Topics  . Alcohol use: No  . Drug use: No     Allergies   Patient has no known allergies.   Review of Systems Review of Systems  Constitutional:       Per HPI, otherwise negative  HENT:       Per HPI, otherwise negative  Respiratory:       Per HPI, otherwise negative  Cardiovascular:       Per HPI, otherwise negative  Gastrointestinal: Negative for vomiting.  Endocrine:       Negative aside from HPI  Genitourinary:       Neg aside from HPI   Musculoskeletal:       Per HPI, otherwise negative  Skin: Negative.   Neurological: Negative for syncope.     Physical Exam Updated Vital Signs BP (!) 159/86   Pulse 75   Temp 98 F (36.7 C) (Oral)   Resp 16   SpO2 100%   Physical Exam  Constitutional:  She is oriented to person, place, and time. She appears well-developed and well-nourished. No distress.  HENT:  Head: Normocephalic and atraumatic.  Eyes: Conjunctivae and EOM are normal.  Cardiovascular: Normal rate and regular rhythm.  Pulmonary/Chest: Effort normal and breath sounds normal. No stridor. No respiratory distress.  Abdominal: She exhibits no distension.  Musculoskeletal: She exhibits no edema.  Neurological: She is alert and oriented to person, place, and time. No cranial nerve deficit.  Skin: Skin is warm and dry.  Psychiatric: She has a normal mood and affect.  Nursing note and vitals reviewed.    ED Treatments / Results  Labs (all labs ordered are listed, but only abnormal results are displayed) Labs Reviewed    BASIC METABOLIC PANEL - Abnormal; Notable for the following components:      Result Value   Glucose, Bld 135 (*)    All other components within normal limits  CBC - Abnormal; Notable for the following components:   Hemoglobin 11.3 (*)    HCT 35.5 (*)    MCV 70.6 (*)    MCH 22.5 (*)    All other components within normal limits  I-STAT TROPONIN, ED    EKG  EKG Interpretation  Date/Time:  Saturday May 11 2017 10:46:10 EST Ventricular Rate:  69 PR Interval:  122 QRS Duration: 74 QT Interval:  398 QTC Calculation: 426 R Axis:   3 Text Interpretation:  Normal sinus rhythm Normal ECG Confirmed by Gerhard MunchLockwood, Sereena Marando [phone]([id number]) on 05/11/2017 1:06:25 PM       Radiology Dg Chest 2 View  Result Date: 05/11/2017 CLINICAL DATA:  PT. States that she has been having a "fluttering" feeling on the right side of her chest since thursday night, denies CP. Hx of HTN, and diabetes. Pt states she has also been coughing a lot more than usual. EXAM: CHEST  2 VIEW COMPARISON:  None. FINDINGS: Heart size and mediastinal contours are normal. Lungs are clear. No pleural effusion or pneumothorax seen. No acute or suspicious osseous finding. IMPRESSION: No active cardiopulmonary disease. No evidence of pneumonia or pulmonary edema P Electronically Signed   By: Bary RichardStan  Maynard M.D.   On: 05/11/2017 13:32    Procedures Procedures (including critical care time)    Initial Impression / Assessment and Plan / ED Course  I have reviewed the triage vital signs and the nursing notes.  Pertinent labs & imaging results that were available during my care of the patient were reviewed by me and considered in my medical decision making (see chart for details).  Well-appearing female presents with concern of palpitations. She is awake, alert, in no distress. No evidence for sustained arrhythmia, EKG unremarkable labs unremarkable beyond mild anemia, which may be contributing to her palpitations, but is not at a  dangerous level. X-ray unremarkable With reassuring physical exam, vitals, labs, x-ray, patient is appropriate for close outpatient follow-up with her cardiology colleagues for consideration of Holter monitoring.  Final Clinical Impressions(s) / ED Diagnoses  Palpitations   Gerhard MunchLockwood, Larin Weissberg, MD 05/11/17 1418

## 2017-05-31 NOTE — Progress Notes (Deleted)
[facility] - Clinic Note  06/04/2017     CHIEF COMPLAINT Patient presents for No chief complaint on file.   HISTORY OF PRESENT ILLNESS: Kimberly Hammond is a 66 y.o. female who presents to the clinic today for:     Referring physician: Darrow BussingKoirala, Dibas, MD [address] [place], [place] [address]  HISTORICAL INFORMATION:   Selected notes from the MEDICAL RECORD NUMBER Referred form Dr. Zetta Bills. Groat for concern of macular edema OS;  Ocular Hx- NPDR OU; cataract OU;  PMH- Type 2 DM; HTN   CURRENT MEDICATIONS: No current outpatient medications on file. (Ophthalmic Drugs)   No current facility-administered medications for this visit.  (Ophthalmic Drugs)   Current Outpatient Medications (Other)  Medication Sig   aspirin 81 MG chewable tablet Chew 81 mg by mouth daily.   glipiZIDE (GLUCOTROL) 5 MG tablet Take 5 mg 2 (two) times daily before a meal by mouth.    insulin glargine (LANTUS) 100 UNIT/ML injection Inject 24 Units into the skin at bedtime.   lisinopril-hydrochlorothiazide (PRINZIDE,ZESTORETIC) 20-25 MG tablet Take 1 tablet by mouth daily.   metFORMIN (GLUCOPHAGE) 500 MG tablet Take 1,000 mg 2 (two) times daily with a meal by mouth.    metoprolol succinate (TOPROL-XL) 25 MG 24 hr tablet Take 25 mg by mouth daily.   mupirocin ointment (BACTROBAN) 2 % Apply 1 application topically 2 (two) times daily.   naproxen (NAPROSYN) 500 MG tablet Take 500 mg 2 (two) times daily with a meal by mouth.    No current facility-administered medications for this visit.  (Other)      REVIEW OF SYSTEMS:    ALLERGIES No Known Allergies  PAST MEDICAL HISTORY Past Medical History:  Diagnosis Date   Diabetes mellitus without complication (HCC)    Hypertension    Past Surgical History:  Procedure Laterality Date   EYE SURGERY  2015   OS/OD   HYSTERECTOMY ABDOMINAL WITH SALPINGECTOMY      FAMILY HISTORY Family History  Problem  Relation Age of Onset   Hypertension Mother    Diabetes Maternal Aunt     SOCIAL HISTORY Social History   Tobacco Use   Smoking status: Never Smoker   Smokeless tobacco: Never Used  Substance Use Topics   Alcohol use: No   Drug use: No         OPHTHALMIC EXAM:   Not recorded      IMAGING AND PROCEDURES  Imaging and Procedures for 05/31/17           ASSESSMENT/PLAN:    ICD-10-CM   1. Moderate nonproliferative diabetic retinopathy of both eyes with macular edema associated with type 2 diabetes mellitus (HCC) E11.3313 OCT, Retina - OU - Both Eyes  2. Hypertensive retinopathy of both eyes H35.033   3. Nuclear sclerosis of both eyes H25.13     1. Moderate non-proliferative diabetic retinopathy, both eyes - The incidence, risk factors for progression, natural history and treatment options for diabetic retinopathy  were discussed with patient.   - The need for close monitoring of blood glucose, blood pressure, and serum lipids, avoiding cigarette or any type of tobacco, and the need for long term follow up was also discussed with patient. - no NV noted on exam or FA - Diabetic macular edema, both eyes   - The natural history, pathology, and characteristics of diabetic macular edema discussed with patient.  A generalized discussion of the major clinical trials concerning treatment of diabetic  macular edema (ETDRS, DCT, SCORE, RISE / RIDE, and ongoing DRCR net studies) was completed.    - This discussion included mention of the various approaches to treating diabetic macular edema (observation, laser photocoagulation, anti-VEGF injections with lucentis / Avastin / Eylea, steroid injections with Kenalog / Ozurdex, and intraocular surgery with vitrectomy).    - The goal hemoglobin A1C of 6-7 was discussed, as well as importance of smoking cessation and hypertension control.    - Need for ongoing treatment and monitoring were specifically discussed with reference to  chronic nature of diabetic macular edema.   - mild DME today with no significant impact on fovea and VA at this time -- BCVA remains 20/30 and 20/20  - recommend monitoring for now -- pt agrees with plan of care  - f/u in 6 wks with repeat FA -- possibly [place] -- pt with difficulty today in [employer]   2. Hypertensive retinopathy OU - discussed importance of tight BP control - monitor  3. Nuclear sclerosis OU - The symptoms of cataract, surgical options, and treatments and risks were discussed with patient. - discussed diagnosis and progression - under the expert care of Dr. Zetta Bills. Groat   Ophthalmic Meds Ordered this visit:  No orders of the defined types were placed in this encounter.      No Follow-up on file.  There are no Patient Instructions on file for this visit.   Explained the diagnoses, plan, and follow up with the patient and they expressed understanding.  Patient expressed understanding of the importance of proper follow up care.   This document serves as a record of services personally performed by Karie ChimeraBrian G. Zamora, MD, PhD. It was created on their behalf by Virgilio BellingMeredith Fabian, COA, a certified ophthalmic assistant. The creation of this record is the provider's dictation and/or activities during the visit.  Electronically signed by: Virgilio BellingMeredith Fabian, COA  05/31/17 4:45 PM    Karie ChimeraBrian G. Zamora, M.D., Ph.D. Diseases & Surgery of the Retina and Vitreous [facility] 05/31/17     Abbreviations: M myopia (nearsighted); A astigmatism; H hyperopia (farsighted); P presbyopia; Mrx spectacle prescription;  CTL contact lenses; OD right eye; OS left eye; OU both eyes  XT exotropia; ET esotropia; PEK punctate epithelial keratitis; PEE punctate epithelial erosions; DES dry eye syndrome; MGD meibomian gland dysfunction; ATs artificial tears; PFAT's preservative free artificial tears; NSC nuclear sclerotic cataract; PSC posterior subcapsular cataract; ERM  epi-retinal membrane; PVD posterior vitreous detachment; RD retinal detachment; DM diabetes mellitus; DR diabetic retinopathy; NPDR non-proliferative diabetic retinopathy; PDR proliferative diabetic retinopathy; CSME clinically significant macular edema; DME diabetic macular edema; dbh dot blot hemorrhages; CWS cotton wool spot; POAG primary open angle glaucoma; C/D cup-to-disc ratio; HVF humphrey visual field; GVF goldmann visual field; OCT optical coherence tomography; IOP intraocular pressure; BRVO Branch retinal vein occlusion; CRVO central retinal vein occlusion; CRAO central retinal artery occlusion; BRAO branch retinal artery occlusion; RT retinal tear; SB scleral buckle; PPV pars plana vitrectomy; VH Vitreous hemorrhage; PRP panretinal laser photocoagulation; IVK intravitreal kenalog; VMT vitreomacular traction; MH Macular hole;  NVD neovascularization of the disc; NVE neovascularization elsewhere; AREDS age related eye disease study; ARMD age related macular degeneration; POAG primary open angle glaucoma; EBMD epithelial/anterior basement membrane dystrophy; ACIOL anterior chamber intraocular lens; IOL intraocular lens; PCIOL posterior chamber intraocular lens; Phaco/IOL phacoemulsification with intraocular lens placement; PRK photorefractive keratectomy; LASIK laser assisted in situ keratomileusis; HTN hypertension; DM diabetes mellitus; COPD chronic obstructive pulmonary disease

## 2017-06-04 ENCOUNTER — Encounter (INDEPENDENT_AMBULATORY_CARE_PROVIDER_SITE_OTHER): Payer: PPO | Admitting: Ophthalmology

## 2017-06-06 ENCOUNTER — Ambulatory Visit
Admission: RE | Admit: 2017-06-06 | Discharge: 2017-06-06 | Disposition: A | Discharge status: Home / Self Care | Payer: PPO | Source: Ambulatory Visit | Attending: Family Medicine | Admitting: Family Medicine

## 2017-06-06 DIAGNOSIS — Z1231 Encounter for screening mammogram for malignant neoplasm of breast: Secondary | ICD-10-CM

## 2017-06-07 NOTE — Progress Notes (Signed)
[facility] - Clinic Note  06/10/2017     CHIEF COMPLAINT Patient presents for Retina Follow Up   HISTORY OF PRESENT ILLNESS: Kimberly MarchConnie P Hammond is a 66 y.o. female who presents to the clinic today for:   HPI    Retina Follow Up    Patient presents with  Diabetic Retinopathy.  In both eyes.  Severity is moderate.  Duration of 7 weeks.  Since onset it is stable.          Comments    Pt presents today for follow up of NPDR, pt states vision is stable since last visit, she denies flashes, floaters, wavy vision or occular pain; pt states her DM is also stable since last visit, denies using gtts        Last edited by Kimberly Hammond, Kimberly Klinger, Hammond on 06/10/2017 12:42 PM. (History)    Pt reports there has been no noticeable change in OU VA since being seen last;   Referring physician: Darrow Hammond, [place], Hammond [address] [place], [place] [address]  HISTORICAL INFORMATION:   Selected notes from the MEDICAL RECORD NUMBER Referred form Dr. Zetta Hammond. Groat for concern of macular edema OS;  Ocular Hx- NPDR OU; cataract OU;  PMH- Type 2 DM; HTN   CURRENT MEDICATIONS: No current outpatient medications on file. (Ophthalmic Drugs)   No current facility-administered medications for this visit.  (Ophthalmic Drugs)   Current Outpatient Medications (Other)  Medication Sig  . aspirin 81 MG chewable tablet Chew 81 mg by mouth daily.  Marland Kitchen. glipiZIDE (GLUCOTROL) 5 MG tablet Take 5 mg 2 (two) times daily before a meal by mouth.   . insulin glargine (LANTUS) 100 UNIT/ML injection Inject 24 Units into the skin at bedtime.  Marland Kitchen. lisinopril-hydrochlorothiazide (PRINZIDE,ZESTORETIC) 20-25 MG tablet Take 1 tablet by mouth daily.  . metFORMIN (GLUCOPHAGE) 500 MG tablet Take 1,000 mg 2 (two) times daily with a meal by mouth.   . metoprolol succinate (TOPROL-XL) 25 MG 24 hr tablet Take 25 mg by mouth daily.  . mupirocin ointment (BACTROBAN) 2 % Apply 1 application topically 2 (two) times daily.   . naproxen (NAPROSYN) 500 MG tablet Take 500 mg 2 (two) times daily with a meal by mouth.    No current facility-administered medications for this visit.  (Other)      REVIEW OF SYSTEMS: ROS    Positive for: Endocrine, Eyes   Negative for: Constitutional, Gastrointestinal, Neurological, Skin, Genitourinary, Musculoskeletal, HENT, Cardiovascular, Respiratory, Psychiatric, Allergic/Imm, Heme/Lymph   Last edited by Kimberly Hammond, Kimberly J, COT on 06/10/2017  8:49 AM. (History)       ALLERGIES No Known Allergies  PAST MEDICAL HISTORY Past Medical History:  Diagnosis Date  . Diabetes mellitus without complication (HCC)   . Hypertension    Past Surgical History:  Procedure Laterality Date  . EYE SURGERY  2015   OS/OD  . HYSTERECTOMY ABDOMINAL WITH SALPINGECTOMY      FAMILY HISTORY Family History  Problem Relation Age of Onset  . Hypertension Mother   . Diabetes Maternal Aunt   . Breast cancer Other     SOCIAL HISTORY Social History   Tobacco Use  . Smoking status: Never Smoker  . Smokeless tobacco: Never Used  Substance Use Topics  . Alcohol use: No  . Drug use: No         OPHTHALMIC EXAM:  Base Eye Exam    Visual Acuity (Snellen - Linear)      Right Left  Dist [place] 20/40 -1 20/30 -2   Dist ph [place] 20/30 -2 20/25 -1       Tonometry (Tonopen, 8:45 AM)      Right Left   Pressure 16 16       Pupils      Dark Light Shape React APD   Right 4 3 Round Slow None   Left 4 3 Round Slow None       Visual Fields      Left Right    Full Full       Extraocular Movement      Right Left    Full, Ortho Full, Ortho       Neuro/Psych    Oriented x3:  Yes   Mood/Affect:  Normal       Dilation    Both eyes:  1.0% Mydriacyl, 2.5% Phenylephrine @ 8:45 AM        Slit Lamp and Fundus Exam    Slit Lamp Exam      Right Left   Lids/Lashes Dermatochalasis - upper lid Dermatochalasis - upper lid   Conjunctiva/Sclera White and quiet White and quiet   Cornea Trace  Punctate epithelial erosions, Arcus Trace Punctate epithelial erosions, Arcus   Anterior Chamber Deep and quiet Deep and quiet   Iris Round and dilated, No NVI Round and dilated, No NVI   Lens 2+ Nuclear sclerosis, 2+ Cortical cataract, Vacuoles 2+ Nuclear sclerosis, 2+ Cortical cataract, Vacuoles   Vitreous Vitreous syneresis Vitreous syneresis       Fundus Exam      Right Left   Disc Normal, No NVD Normal, No NVD   C/D Ratio 0.4 0.3   Macula good foveal relfex, Retinal pigment epithelial mottling, Temporal Microaneurysms, Exudates temporal to fovea, Blot hemorrhage perifoveal exudates extending temporally, Microaneurysms, Good foveal reflex   Vessels Mild copper wiring Mild copper wiring   Periphery Attached, Rare MA, Temporal Early Cotton wool spots Attached, Rare MA, Temporal Early Cotton wool spots          IMAGING AND PROCEDURES  Imaging and Procedures for 06/10/17  OCT, Retina - OU - Both Eyes     Right Eye Quality was good. Central Foveal Thickness: 225. Progression has been stable. Findings include normal foveal contour, no SRF, vitreomacular adhesion , intraretinal fluid (Non central DME / IRF temporal to fovea caught on wide field OCT).   Left Eye Quality was good. Central Foveal Thickness: 231. Progression has been stable. Findings include normal foveal contour, no SRF, intraretinal fluid, vitreomacular adhesion  (DME / IRF just inferotemporal to fovea).   Notes Images taken, stored on drive  Diagnosis / Impression:  DME OU, OS>OD  Clinical management:  See below  Abbreviations: NFP - Normal foveal profile. CME - cystoid macular edema. PED - pigment epithelial detachment. IRF - intraretinal fluid. SRF - subretinal fluid. EZ - ellipsoid zone. ERM - epiretinal membrane. ORA - outer retinal atrophy. ORT - outer retinal tubulation. SRHM - subretinal hyper-reflective material         Fluorescein Angiography [place] (Transit OS)     Right Eye Progression has no  prior data. Early phase findings include microaneurysm. Mid/Late phase findings include microaneurysm, leakage, vascular perfusion defect.   Left Eye Progression has no prior data. Early phase findings include microaneurysm. Mid/Late phase findings include vascular perfusion defect, microaneurysm, leakage.   Notes Focal hyperfluorescence consistent with leaking MA temporal macula OU; amenable to focal  ASSESSMENT/PLAN:    ICD-10-CM   1. Moderate nonproliferative diabetic retinopathy of both eyes with macular edema associated with type 2 diabetes mellitus (HCC) E11.3313 OCT, Retina - OU - Both Eyes    Fluorescein Angiography [place] (Transit OS)  2. Hypertensive retinopathy of both eyes H35.033 Fluorescein Angiography [place] (Transit OS)  3. Nuclear sclerosis of both eyes H25.13     1. Moderate non-proliferative diabetic retinopathy, both eyes -- stable - The incidence, risk factors for progression, natural history and treatment options for diabetic retinopathy were discussed with patient.   - The need for close monitoring of blood glucose, blood pressure, and serum lipids, avoiding cigarette or any type of tobacco, and the need for long term follow up was also discussed with patient. - no NV noted on exam or FA - Diabetic macular edema, both eyes   - The natural history, pathology, and characteristics of diabetic macular edema discussed with patient.  A generalized discussion of the major clinical trials concerning treatment of diabetic macular edema (ETDRS, DCT, SCORE, RISE / RIDE, and ongoing DRCR net studies) was completed.    - This discussion included mention of the various approaches to treating diabetic macular edema (observation, laser photocoagulation, anti-VEGF injections with lucentis / Avastin / Eylea, steroid injections with Kenalog / Ozurdex, and intraocular surgery with vitrectomy).    - The goal hemoglobin A1C of 6-7 was discussed, as well as  importance of smoking cessation and hypertension control.    - Need for ongoing treatment and monitoring were specifically discussed with reference to chronic nature of diabetic macular edema.   - mild DME stable today -- BCVA remains 20/30 and 20/25  - repeat FA today shows leaking microaneurysms amenable to focal laser  - pt wishes to observe for now now -- reasonable  - discussed possibility of focal laser treatment at next f/u (OS, then OD)  - f/u in 2-3 months for DFE/OCT/possible focal laser   2. Hypertensive retinopathy OU - discussed importance of tight BP control - monitor  3. Nuclear sclerosis OU - The symptoms of cataract, surgical options, and treatments and risks were discussed with patient. - discussed diagnosis and progression - under the expert care of Dr. Zetta Hammond   Ophthalmic Meds Ordered this visit:  No orders of the defined types were placed in this encounter.      Return in about 3 months (around 09/08/2017) for F/U NPDR OU.  There are no Patient Instructions on file for this visit.   Explained the diagnoses, plan, and follow up with the patient and they expressed understanding.  Patient expressed understanding of the importance of proper follow up care.   This document serves as a record of services personally performed by Karie Chimera, MD, PhD. It was created on their behalf by Virgilio Belling, COA, a certified ophthalmic assistant. The creation of this record is the provider's dictation and/or activities during the visit.  Electronically signed by: Virgilio Belling, COA  06/10/17 12:42 PM    Karie Chimera, M.D., Ph.D. Diseases & Surgery of the Retina and Vitreous [facility] 06/10/17  I have reviewed the above documentation for accuracy and completeness, and I agree with the above. Karie Chimera, M.D., Ph.D. 06/10/17 12:42 PM     Abbreviations: M myopia (nearsighted); A astigmatism; H hyperopia (farsighted); P  presbyopia; Mrx spectacle prescription;  CTL contact lenses; OD right eye; OS left eye; OU both eyes  XT exotropia; ET esotropia; PEK punctate epithelial  keratitis; PEE punctate epithelial erosions; DES dry eye syndrome; MGD meibomian gland dysfunction; ATs artificial tears; PFAT's preservative free artificial tears; [place] nuclear sclerotic cataract; PSC posterior subcapsular cataract; ERM epi-retinal membrane; PVD posterior vitreous detachment; RD retinal detachment; DM diabetes mellitus; DR diabetic retinopathy; NPDR non-proliferative diabetic retinopathy; PDR proliferative diabetic retinopathy; CSME clinically significant macular edema; DME diabetic macular edema; dbh dot blot hemorrhages; CWS cotton wool spot; POAG primary open angle glaucoma; C/D cup-to-disc ratio; HVF humphrey visual field; GVF goldmann visual field; OCT optical coherence tomography; IOP intraocular pressure; BRVO Branch retinal vein occlusion; CRVO central retinal vein occlusion; CRAO central retinal artery occlusion; BRAO branch retinal artery occlusion; RT retinal tear; SB scleral buckle; PPV pars plana vitrectomy; VH Vitreous hemorrhage; PRP panretinal laser photocoagulation; IVK intravitreal kenalog; VMT vitreomacular traction; MH Macular hole;  NVD neovascularization of the disc; NVE neovascularization elsewhere; AREDS age related eye disease study; ARMD age related macular degeneration; POAG primary open angle glaucoma; EBMD epithelial/anterior basement membrane dystrophy; ACIOL anterior chamber intraocular lens; IOL intraocular lens; PCIOL posterior chamber intraocular lens; Phaco/IOL phacoemulsification with intraocular lens placement; [place] photorefractive keratectomy; LASIK laser assisted in situ keratomileusis; HTN hypertension; DM diabetes mellitus; COPD chronic obstructive pulmonary disease

## 2017-06-10 ENCOUNTER — Encounter (INDEPENDENT_AMBULATORY_CARE_PROVIDER_SITE_OTHER): Payer: Self-pay | Admitting: Ophthalmology

## 2017-06-10 ENCOUNTER — Ambulatory Visit (INDEPENDENT_AMBULATORY_CARE_PROVIDER_SITE_OTHER): Payer: PPO | Admitting: Ophthalmology

## 2017-06-10 DIAGNOSIS — H2513 Age-related nuclear cataract, bilateral: Secondary | ICD-10-CM | POA: Diagnosis not present

## 2017-06-10 DIAGNOSIS — H35033 Hypertensive retinopathy, bilateral: Secondary | ICD-10-CM | POA: Diagnosis not present

## 2017-06-10 DIAGNOSIS — E113313 Type 2 diabetes mellitus with moderate nonproliferative diabetic retinopathy with macular edema, bilateral: Secondary | ICD-10-CM | POA: Diagnosis not present

## 2017-08-14 DIAGNOSIS — E1165 Type 2 diabetes mellitus with hyperglycemia: Secondary | ICD-10-CM | POA: Diagnosis not present

## 2017-08-14 DIAGNOSIS — E7800 Pure hypercholesterolemia, unspecified: Secondary | ICD-10-CM | POA: Diagnosis not present

## 2017-08-14 DIAGNOSIS — E669 Obesity, unspecified: Secondary | ICD-10-CM | POA: Diagnosis not present

## 2017-08-14 DIAGNOSIS — I10 Essential (primary) hypertension: Secondary | ICD-10-CM | POA: Diagnosis not present

## 2017-09-10 NOTE — Progress Notes (Signed)
[facility] - Clinic Note  09/11/2017     CHIEF COMPLAINT Patient presents for Retina Follow Up   HISTORY OF PRESENT ILLNESS: Kimberly Hammond is a 67 y.o. female who presents to the clinic today for:   HPI    Retina Follow Up    Patient presents with  Diabetic Retinopathy.  In both eyes.  Severity is moderate.  Duration of 3 months.  Since onset it is stable.  I, the attending physician,  performed the HPI with the patient and updated documentation appropriately.          Comments    F/U NPDR. Patient states there has not been any noticeable changes in her vision ou, since last ov. Denies flashes floaters and ocular pain. Pt reports she does not check BS regularly, her last A1C 7.7 (07/2017), Bs have been stable per pt. Denies gtt's/vit's       Last edited by Rennis Chris, MD on 09/11/2017  2:45 PM. (History)       Referring physician: Sallye Lat, MD [address] [place], [place] [address]  HISTORICAL INFORMATION:   Selected notes from the MEDICAL RECORD NUMBER Referred form Dr. Zetta Bills for concern of macular edema OS;  Ocular Hx- NPDR OU; cataract OU;  PMH- Type 2 DM; HTN   CURRENT MEDICATIONS: Current Outpatient Medications (Ophthalmic Drugs)  Medication Sig  . prednisoLONE acetate (PRED FORTE) 1 % ophthalmic suspension Place 1 drop into the left eye 4 (four) times daily for 7 days.   No current facility-administered medications for this visit.  (Ophthalmic Drugs)   Current Outpatient Medications (Other)  Medication Sig  . aspirin 81 MG chewable tablet Chew 81 mg by mouth daily.  Marland Kitchen glipiZIDE (GLUCOTROL) 5 MG tablet Take 5 mg 2 (two) times daily before a meal by mouth.   . insulin glargine (LANTUS) 100 UNIT/ML injection Inject 24 Units into the skin at bedtime.  Marland Kitchen lisinopril-hydrochlorothiazide (PRINZIDE,ZESTORETIC) 20-25 MG tablet Take 1 tablet by mouth daily.  . metFORMIN (GLUCOPHAGE) 500 MG tablet Take 1,000 mg 2 (two) times  daily with a meal by mouth.   . metoprolol succinate (TOPROL-XL) 25 MG 24 hr tablet Take 25 mg by mouth daily.  . mupirocin ointment (BACTROBAN) 2 % Apply 1 application topically 2 (two) times daily.  . naproxen (NAPROSYN) 500 MG tablet Take 500 mg 2 (two) times daily with a meal by mouth.    No current facility-administered medications for this visit.  (Other)      REVIEW OF SYSTEMS: ROS    Positive for: Endocrine, Eyes   Negative for: Constitutional, Gastrointestinal, Neurological, Skin, Genitourinary, Musculoskeletal, HENT, Cardiovascular, Respiratory, Psychiatric, Allergic/Imm, Heme/Lymph   Last edited by Eldridge Scot, LPN on 10/01/8117  8:46 AM. (History)       ALLERGIES No Known Allergies  PAST MEDICAL HISTORY Past Medical History:  Diagnosis Date  . Diabetes mellitus without complication (HCC)   . Hypertension    Past Surgical History:  Procedure Laterality Date  . EYE SURGERY  2015   OS/OD  . HYSTERECTOMY ABDOMINAL WITH SALPINGECTOMY      FAMILY HISTORY Family History  Problem Relation Age of Onset  . Hypertension Mother   . Diabetes Maternal Aunt   . Breast cancer Other     SOCIAL HISTORY Social History   Tobacco Use  . Smoking status: Never Smoker  . Smokeless tobacco: Never Used  Substance Use Topics  . Alcohol use: No  . Drug  use: No         OPHTHALMIC EXAM:  Base Eye Exam    Visual Acuity (Snellen - Linear)      Right Left   Dist [place] 20/40 20/25   Dist ph [place] 20/25 20/20 -2       Tonometry (Tonopen, 8:50 AM)      Right Left   Pressure 15 14       Pupils      Dark Light Shape React APD   Right 4 3 Round Slow None   Left 4 3 Round Slow None       Visual Fields (Counting fingers)      Left Right    Full Full       Extraocular Movement      Right Left    Full, Ortho Full, Ortho       Neuro/Psych    Oriented x3:  Yes   Mood/Affect:  Normal       Dilation    Both eyes:  1.0% Mydriacyl, 2.5% Phenylephrine @ 8:50 AM         Slit Lamp and Fundus Exam    Slit Lamp Exam      Right Left   Lids/Lashes Dermatochalasis - upper lid Dermatochalasis - upper lid   Conjunctiva/Sclera White and quiet White and quiet   Cornea Trace Punctate epithelial erosions, Arcus Trace Punctate epithelial erosions, Arcus   Anterior Chamber Deep and quiet Deep and quiet   Iris Round and dilated, No NVI Round and dilated, No NVI   Lens 2-3+ Nuclear sclerosis, 2+ Cortical cataract, Vacuoles 2-3+ Nuclear sclerosis, 2+ Cortical cataract, Vacuoles   Vitreous Vitreous syneresis Vitreous syneresis       Fundus Exam      Right Left   Disc Normal, No NVD Normal, No NVD   C/D Ratio 0.4 0.4   Macula good foveal relfex, Retinal pigment epithelial mottling, Temporal Microaneurysms, Exudates temporal to fovea perifoveal exudates extending temporally, Microaneurysms, Good foveal reflex   Vessels Mild copper wiring Mild copper wiring   Periphery Attached, Rare MA, Temporal Early Cotton wool spots Attached, Rare MA, Temporal Early Cotton wool spots, white centered IRH at 0630 mid-zone          IMAGING AND PROCEDURES  Imaging and Procedures for 09/11/17  OCT, Retina - OU - Both Eyes     Right Eye Quality was good. Central Foveal Thickness: 223. Progression has been stable. Findings include normal foveal contour, no SRF, vitreomacular adhesion , intraretinal fluid (Non central DME / IRF temporal to fovea caught on wide field OCT - slightly worse).   Left Eye Quality was good. Central Foveal Thickness: 224. Progression has been stable. Findings include normal foveal contour, no SRF, intraretinal fluid, vitreomacular adhesion  (DME / IRF just inferotemporal to fovea - slightly increased).   Notes Images taken, stored on drive  Diagnosis / Impression:  DME OU, OS>OD  Clinical management:  See below  Abbreviations: NFP - Normal foveal profile. CME - cystoid macular edema. PED - pigment epithelial detachment. IRF - intraretinal fluid.  SRF - subretinal fluid. EZ - ellipsoid zone. ERM - epiretinal membrane. ORA - outer retinal atrophy. ORT - outer retinal tubulation. SRHM - subretinal hyper-reflective material         Focal Laser - OS - Left Eye     LASER PROCEDURE NOTE  Diagnosis:   Diabetic macular edema, left eye  Procedure:  Focal laser photocoagulation using slit lamp laser, left eye  Anesthesia:  Topical  Surgeon: Rennis Chris, MD, PhD   Informed consent obtained, operative eye marked, and time out performed prior to initiation of laser.   [place] [id number] Focal/Grid laser Power: 90 mW Duration: 50 msec  Spot size: 50 microns  # spots:  54 spots placed to Mas and edema inferior and temporal to fovea  Complications: None.  RTC: 2-3 wks for focal laser OD  Patient tolerated the procedure well and received written and verbal post-procedure care information/education.                ASSESSMENT/PLAN:    ICD-10-CM   1. Moderate nonproliferative diabetic retinopathy of both eyes with macular edema associated with type 2 diabetes mellitus (HCC) E11.3313 OCT, Retina - OU - Both Eyes    Focal Laser - OS - Left Eye  2. Hypertensive retinopathy of both eyes H35.033   3. Nuclear sclerosis of both eyes H25.13     1. Moderate non-proliferative diabetic retinopathy, both eyes -- stable - The incidence, risk factors for progression, natural history and treatment options for diabetic retinopathy were discussed with patient.   - The need for close monitoring of blood glucose, blood pressure, and serum lipids, avoiding cigarette or any type of tobacco, and the need for long term follow up was also discussed with patient. - no NV noted on exam or prior FA - Diabetic macular edema, both eyes   - The natural history, pathology, and characteristics of diabetic macular edema discussed with patient.  A generalized discussion of the major clinical trials concerning treatment of diabetic macular edema (ETDRS, DCT,  SCORE, RISE / RIDE, and ongoing DRCR net studies) was completed.    - This discussion included mention of the various approaches to treating diabetic macular edema (observation, laser photocoagulation, anti-VEGF injections with lucentis / Avastin / Eylea, steroid injections with Kenalog / Ozurdex, and intraocular surgery with vitrectomy).    - The goal hemoglobin A1C of 6-7 was discussed, as well as importance of smoking cessation and hypertension control.    - Need for ongoing treatment and monitoring were specifically discussed with reference to chronic nature of diabetic macular edema.   - DME slightly worse today OU  - repeat FA last visit showed leaking microaneurysms amenable to focal laser  - recommend focal laser OU, OS today (03.20.19)  - RBA of procedure discussed, questions answered  - informed consent obtained and signed  - see procedure note  - pt tolerated procedure well  - start PF QID OS x7 days  - f/u in 2 weeks for DFE/OCT/possible focal laser OD   2. Hypertensive retinopathy OU - discussed importance of tight BP control - monitor  3. Nuclear sclerosis OU - The symptoms of cataract, surgical options, and treatments and risks were discussed with patient. - discussed diagnosis and progression - under the expert care of Dr. Zetta Bills   Ophthalmic Meds Ordered this visit:  Meds ordered this encounter  Medications  . prednisoLONE acetate (PRED FORTE) 1 % ophthalmic suspension    Sig: Place 1 drop into the left eye 4 (four) times daily for 7 days.    Dispense:  10 mL    Refill:  0       Return in about 2 weeks (around 09/25/2017) for F/U NPDR OU.  There are no Patient Instructions on file for this visit.   Explained the diagnoses, plan, and follow up with the patient and they expressed understanding.  Patient expressed understanding of the importance  of proper follow up care.   This document serves as a record of services personally performed by Karie Chimera, MD,  PhD. It was created on their behalf by Virgilio Belling, COA, a certified ophthalmic assistant. The creation of this record is the provider's dictation and/or activities during the visit.  Electronically signed by: Virgilio Belling, COA  09/11/17 11:29 PM    Karie Chimera, M.D., Ph.D. Diseases & Surgery of the Retina and Vitreous [facility] 09/11/17  I have reviewed the above documentation for accuracy and completeness, and I agree with the above. Karie Chimera, M.D., Ph.D. 09/11/17 11:32 PM     Abbreviations: M myopia (nearsighted); A astigmatism; H hyperopia (farsighted); P presbyopia; Mrx spectacle prescription;  CTL contact lenses; OD right eye; OS left eye; OU both eyes  XT exotropia; ET esotropia; PEK punctate epithelial keratitis; PEE punctate epithelial erosions; DES dry eye syndrome; MGD meibomian gland dysfunction; ATs artificial tears; PFAT's preservative free artificial tears; [place] nuclear sclerotic cataract; PSC posterior subcapsular cataract; ERM epi-retinal membrane; PVD posterior vitreous detachment; RD retinal detachment; DM diabetes mellitus; DR diabetic retinopathy; NPDR non-proliferative diabetic retinopathy; PDR proliferative diabetic retinopathy; CSME clinically significant macular edema; DME diabetic macular edema; dbh dot blot hemorrhages; CWS cotton wool spot; POAG primary open angle glaucoma; C/D cup-to-disc ratio; HVF humphrey visual field; GVF goldmann visual field; OCT optical coherence tomography; IOP intraocular pressure; BRVO Branch retinal vein occlusion; CRVO central retinal vein occlusion; CRAO central retinal artery occlusion; BRAO branch retinal artery occlusion; RT retinal tear; SB scleral buckle; PPV pars plana vitrectomy; VH Vitreous hemorrhage; PRP panretinal laser photocoagulation; IVK intravitreal kenalog; VMT vitreomacular traction; MH Macular hole;  NVD neovascularization of the disc; NVE neovascularization elsewhere; AREDS age  related eye disease study; ARMD age related macular degeneration; POAG primary open angle glaucoma; EBMD epithelial/anterior basement membrane dystrophy; ACIOL anterior chamber intraocular lens; IOL intraocular lens; PCIOL posterior chamber intraocular lens; Phaco/IOL phacoemulsification with intraocular lens placement; PRK photorefractive keratectomy; LASIK laser assisted in situ keratomileusis; HTN hypertension; DM diabetes mellitus; COPD chronic obstructive pulmonary disease

## 2017-09-11 ENCOUNTER — Ambulatory Visit (INDEPENDENT_AMBULATORY_CARE_PROVIDER_SITE_OTHER): Payer: PPO | Admitting: Ophthalmology

## 2017-09-11 ENCOUNTER — Encounter (INDEPENDENT_AMBULATORY_CARE_PROVIDER_SITE_OTHER): Payer: Self-pay | Admitting: Ophthalmology

## 2017-09-11 DIAGNOSIS — E113313 Type 2 diabetes mellitus with moderate nonproliferative diabetic retinopathy with macular edema, bilateral: Secondary | ICD-10-CM | POA: Diagnosis not present

## 2017-09-11 DIAGNOSIS — H35033 Hypertensive retinopathy, bilateral: Secondary | ICD-10-CM | POA: Diagnosis not present

## 2017-09-11 DIAGNOSIS — H2513 Age-related nuclear cataract, bilateral: Secondary | ICD-10-CM | POA: Diagnosis not present

## 2017-09-11 MED ORDER — PREDNISOLONE ACETATE 1 % OP SUSP: 1.0000 [drp] | Freq: Four times a day (QID) | OPHTHALMIC | 0 refills | Status: AC

## 2017-09-24 NOTE — Progress Notes (Signed)
[facility] - Clinic Note  09/26/2017     CHIEF COMPLAINT Patient presents for Retina Follow Up   HISTORY OF PRESENT ILLNESS: Kimberly Hammond is a 67 y.o. female who presents to the clinic today for:   HPI    Retina Follow Up    Patient presents with  Other.  In both eyes.  This started 5 months ago.  Severity is mild.  Since onset it is stable.  I, the attending physician,  performed the HPI with the patient and updated documentation appropriately.          Comments    F/U PDR OU. Patient states she has not noticed any visual changes, denies flashes, floaters and ocular pain. Pt has not checked her Bs in a while, unknown last results, denies hypo/hyper glucemic episodes.       Last edited by Rennis Chris, MD on 09/26/2017  9:38 AM. (History)    Pt states she tolerated laser well at last visit;    Referring physician: Darrow Bussing, MD [address] [place], [place] [address]  HISTORICAL INFORMATION:   Selected notes from the MEDICAL RECORD NUMBER Referred form Dr. Zetta Bills for concern of macular edema OS;  Ocular Hx- NPDR OU; cataract OU;  PMH- Type 2 DM; HTN   CURRENT MEDICATIONS: No current outpatient medications on file. (Ophthalmic Drugs)   No current facility-administered medications for this visit.  (Ophthalmic Drugs)   Current Outpatient Medications (Other)  Medication Sig  . aspirin 81 MG chewable tablet Chew 81 mg by mouth daily.  Marland Kitchen glipiZIDE (GLUCOTROL) 5 MG tablet Take 5 mg 2 (two) times daily before a meal by mouth.   . insulin glargine (LANTUS) 100 UNIT/ML injection Inject 24 Units into the skin at bedtime.  Marland Kitchen lisinopril-hydrochlorothiazide (PRINZIDE,ZESTORETIC) 20-25 MG tablet Take 1 tablet by mouth daily.  . metFORMIN (GLUCOPHAGE) 500 MG tablet Take 1,000 mg 2 (two) times daily with a meal by mouth.   . metoprolol succinate (TOPROL-XL) 25 MG 24 hr tablet Take 25 mg by mouth daily.  . mupirocin ointment (BACTROBAN) 2  % Apply 1 application topically 2 (two) times daily.  . naproxen (NAPROSYN) 500 MG tablet Take 500 mg 2 (two) times daily with a meal by mouth.    No current facility-administered medications for this visit.  (Other)      REVIEW OF SYSTEMS: ROS    Positive for: Endocrine, Eyes   Negative for: Constitutional, Gastrointestinal, Neurological, Skin, Genitourinary, Musculoskeletal, HENT, Cardiovascular, Respiratory, Psychiatric, Allergic/Imm, Heme/Lymph   Last edited by Eldridge Scot, LPN on 6/0/4540  8:40 AM. (History)       ALLERGIES No Known Allergies  PAST MEDICAL HISTORY Past Medical History:  Diagnosis Date  . Diabetes mellitus without complication (HCC)   . Hypertension    Past Surgical History:  Procedure Laterality Date  . EYE SURGERY  2015   OS/OD  . HYSTERECTOMY ABDOMINAL WITH SALPINGECTOMY      FAMILY HISTORY Family History  Problem Relation Age of Onset  . Hypertension Mother   . Diabetes Maternal Aunt   . Breast cancer Other     SOCIAL HISTORY Social History   Tobacco Use  . Smoking status: Never Smoker  . Smokeless tobacco: Never Used  Substance Use Topics  . Alcohol use: No  . Drug use: No         OPHTHALMIC EXAM:  Base Eye Exam    Visual Acuity (Snellen - Linear)  Right Left   Dist [place] 20/25 -2 20/25 -2   Dist ph [place] NI 20/25 +2       Tonometry (Tonopen, 8:44 AM)      Right Left   Pressure 17 14       Pupils      Dark Light Shape React APD   Right 3 2 Round Slow None   Left 3 2 Round Slow None       Visual Fields (Counting fingers)      Left Right    Full Full       Extraocular Movement      Right Left    Full, Ortho Full, Ortho       Neuro/Psych    Oriented x3:  Yes   Mood/Affect:  Normal       Dilation    Both eyes:  1.0% Mydriacyl, 2.5% Phenylephrine @ 8:44 AM        Slit Lamp and Fundus Exam    Slit Lamp Exam      Right Left   Lids/Lashes Dermatochalasis - upper lid Dermatochalasis - upper lid    Conjunctiva/Sclera White and quiet White and quiet   Cornea Trace Punctate epithelial erosions, Arcus Trace Punctate epithelial erosions, Arcus   Anterior Chamber Deep and quiet Deep and quiet   Iris Round and dilated, No NVI Round and dilated, No NVI   Lens 2-3+ Nuclear sclerosis, 2+ Cortical cataract, Vacuoles 2-3+ Nuclear sclerosis, 2+ Cortical cataract, Vacuoles   Vitreous Vitreous syneresis Vitreous syneresis       Fundus Exam      Right Left   Disc Normal, No NVD Normal, No NVD   C/D Ratio 0.4 0.4   Macula good foveal relfex, Retinal pigment epithelial mottling, Temporal Microaneurysms, Exudates temporal to fovea perifoveal exudates extending temporally, Microaneurysms, Good foveal reflex   Vessels Mild copper wiring Mild copper wiring   Periphery Attached, Rare MA, Temporal Early Cotton wool spots Attached, Rare MA, Temporal Early Cotton wool spots, white centered IRH at 0630 mid-zone -- mild laser changes visible          IMAGING AND PROCEDURES  Imaging and Procedures for 09/26/17  OCT, Retina - OU - Both Eyes       Right Eye Quality was good. Central Foveal Thickness: 224. Progression has been stable. Findings include normal foveal contour, no SRF, vitreomacular adhesion , intraretinal fluid (Non central DME / IRF temporal to fovea caught on wide field OCT - stable).   Left Eye Quality was good. Central Foveal Thickness: 230. Progression has been stable. Findings include normal foveal contour, no SRF, intraretinal fluid, vitreomacular adhesion  (DME / IRF just inferotemporal to fovea - stable to slightly increased).   Notes Images taken, stored on drive  Diagnosis / Impression:  DME OU, OS>OD  Clinical management:  See below  Abbreviations: NFP - Normal foveal profile. CME - cystoid macular edema. PED - pigment epithelial detachment. IRF - intraretinal fluid. SRF - subretinal fluid. EZ - ellipsoid zone. ERM - epiretinal membrane. ORA - outer retinal atrophy. ORT -  outer retinal tubulation. SRHM - subretinal hyper-reflective material         Focal Laser - OD - Right Eye       LASER PROCEDURE NOTE  Diagnosis:   Diabetic macular edema, RIGHT EYE  Procedure:  Focal laser photocoagulation using slit lamp laser, RIGHT EYE  Anesthesia:  Topical  Surgeon: Rennis Chris, MD, PhD   Informed consent obtained, operative eye marked,  and time out performed prior to initiation of laser.   Lumenis [id number] Focal/Grid laser Power: 90 mW Duration: 70 msec  Spot size: 50 microns  # spots: 35 spots placed to MAs temporal to fovea  Complications: None.  RTC: 4-6 wks  Patient tolerated the procedure well and received written and verbal post-procedure care information/education.                   ASSESSMENT/PLAN:    ICD-10-CM   1. Moderate nonproliferative diabetic retinopathy of both eyes with macular edema associated with type 2 diabetes mellitus (HCC) E11.3313 OCT, Retina - OU - Both Eyes    Focal Laser - OD - Right Eye  2. Hypertensive retinopathy of both eyes H35.033   3. Nuclear sclerosis of both eyes H25.13     1. Moderate non-proliferative diabetic retinopathy, both eyes -- stable - The incidence, risk factors for progression, natural history and treatment options for diabetic retinopathy were discussed with patient.   - The need for close monitoring of blood glucose, blood pressure, and serum lipids, avoiding cigarette or any type of tobacco, and the need for long term follow up was also discussed with patient. - no NV noted on exam or prior FA - Diabetic macular edema, both eyes   - The natural history, pathology, and characteristics of diabetic macular edema discussed with patient.  A generalized discussion of the major clinical trials concerning treatment of diabetic macular edema (ETDRS, DCT, SCORE, RISE / RIDE, and ongoing DRCR net studies) was completed.    - This discussion included mention of the various approaches to  treating diabetic macular edema (observation, laser photocoagulation, anti-VEGF injections with lucentis / Avastin / Eylea, steroid injections with Kenalog / Ozurdex, and intraocular surgery with vitrectomy).    - The goal hemoglobin A1C of 6-7 was discussed, as well as importance of smoking cessation and hypertension control.    - Need for ongoing treatment and monitoring were specifically discussed with reference to chronic nature of diabetic macular edema.  - S/P focal laser OS (03.20.19) - mild laser changes visible on exam today  - DME persistent today OU  - repeat FA last visit showed leaking microaneurysms amenable to focal laser  - recommend focal laser OD today (04.04.19)  - RBA of procedure discussed, questions answered  - informed consent obtained and signed  - see procedure note  - pt tolerated procedure well  - start PF QID OD x7 days  - f/u in 4-6 weeks for DFE/OCT   2. Hypertensive retinopathy OU - discussed importance of tight BP control - monitor  3. Nuclear sclerosis OU - The symptoms of cataract, surgical options, and treatments and risks were discussed with patient. - discussed diagnosis and progression - under the expert care of Dr. Zetta Bills   Ophthalmic Meds Ordered this visit:  No orders of the defined types were placed in this encounter.      Return for 4-6 wks, Dilated Exam, OCT.  There are no Patient Instructions on file for this visit.   Explained the diagnoses, plan, and follow up with the patient and they expressed understanding.  Patient expressed understanding of the importance of proper follow up care.   This document serves as a record of services personally performed by Karie Chimera, MD, PhD. It was created on their behalf by Virgilio Belling, COA, a certified ophthalmic assistant. The creation of this record is the provider's dictation and/or activities during the visit.  Electronically signed by:  Virgilio BellingMeredith Fabian, COA  09/26/17 9:43  AM    Karie ChimeraBrian G. Julie-Ann Vanmaanen, M.D., Ph.D. Diseases & Surgery of the Retina and Vitreous [facility] 09/26/17   I have reviewed the above documentation for accuracy and completeness, and I agree with the above. Karie ChimeraBrian G. Mykayla Brinton, M.D., Ph.D. 09/26/17 9:43 AM    Abbreviations: M myopia (nearsighted); A astigmatism; H hyperopia (farsighted); P presbyopia; Mrx spectacle prescription;  CTL contact lenses; OD right eye; OS left eye; OU both eyes  XT exotropia; ET esotropia; PEK punctate epithelial keratitis; PEE punctate epithelial erosions; DES dry eye syndrome; MGD meibomian gland dysfunction; ATs artificial tears; PFAT's preservative free artificial tears; NSC nuclear sclerotic cataract; PSC posterior subcapsular cataract; ERM epi-retinal membrane; PVD posterior vitreous detachment; RD retinal detachment; DM diabetes mellitus; DR diabetic retinopathy; NPDR non-proliferative diabetic retinopathy; PDR proliferative diabetic retinopathy; CSME clinically significant macular edema; DME diabetic macular edema; dbh dot blot hemorrhages; CWS cotton wool spot; POAG primary open angle glaucoma; C/D cup-to-disc ratio; HVF humphrey visual field; GVF goldmann visual field; OCT optical coherence tomography; IOP intraocular pressure; BRVO Branch retinal vein occlusion; CRVO central retinal vein occlusion; CRAO central retinal artery occlusion; BRAO branch retinal artery occlusion; RT retinal tear; SB scleral buckle; PPV pars plana vitrectomy; VH Vitreous hemorrhage; PRP panretinal laser photocoagulation; IVK intravitreal kenalog; VMT vitreomacular traction; MH Macular hole;  NVD neovascularization of the disc; NVE neovascularization elsewhere; AREDS age related eye disease study; ARMD age related macular degeneration; POAG primary open angle glaucoma; EBMD epithelial/anterior basement membrane dystrophy; ACIOL anterior chamber intraocular lens; IOL intraocular lens; PCIOL posterior chamber intraocular  lens; Phaco/IOL phacoemulsification with intraocular lens placement; PRK photorefractive keratectomy; LASIK laser assisted in situ keratomileusis; HTN hypertension; DM diabetes mellitus; COPD chronic obstructive pulmonary disease

## 2017-09-26 ENCOUNTER — Encounter (INDEPENDENT_AMBULATORY_CARE_PROVIDER_SITE_OTHER): Payer: Self-pay | Admitting: Ophthalmology

## 2017-09-26 ENCOUNTER — Ambulatory Visit (INDEPENDENT_AMBULATORY_CARE_PROVIDER_SITE_OTHER): Payer: PPO | Admitting: Ophthalmology

## 2017-09-26 DIAGNOSIS — E113313 Type 2 diabetes mellitus with moderate nonproliferative diabetic retinopathy with macular edema, bilateral: Secondary | ICD-10-CM | POA: Diagnosis not present

## 2017-09-26 DIAGNOSIS — H35033 Hypertensive retinopathy, bilateral: Secondary | ICD-10-CM

## 2017-09-26 DIAGNOSIS — H2513 Age-related nuclear cataract, bilateral: Secondary | ICD-10-CM

## 2017-10-23 DIAGNOSIS — I10 Essential (primary) hypertension: Secondary | ICD-10-CM | POA: Diagnosis not present

## 2017-10-23 DIAGNOSIS — E7800 Pure hypercholesterolemia, unspecified: Secondary | ICD-10-CM | POA: Diagnosis not present

## 2017-10-23 DIAGNOSIS — M79661 Pain in right lower leg: Secondary | ICD-10-CM | POA: Diagnosis not present

## 2017-10-23 DIAGNOSIS — Z0001 Encounter for general adult medical examination with abnormal findings: Secondary | ICD-10-CM | POA: Diagnosis not present

## 2017-10-23 DIAGNOSIS — Z79899 Other long term (current) drug therapy: Secondary | ICD-10-CM | POA: Diagnosis not present

## 2017-10-23 DIAGNOSIS — Z1159 Encounter for screening for other viral diseases: Secondary | ICD-10-CM | POA: Diagnosis not present

## 2017-10-23 DIAGNOSIS — E1165 Type 2 diabetes mellitus with hyperglycemia: Secondary | ICD-10-CM | POA: Diagnosis not present

## 2017-11-06 NOTE — Progress Notes (Addendum)
[facility] - Clinic Note  11/07/2017     CHIEF COMPLAINT Patient presents for Retina Follow Up   HISTORY OF PRESENT ILLNESS: Kimberly Hammond is a 67 y.o. female who presents to the clinic today for:   HPI    Retina Follow Up    Patient presents with  Diabetic Retinopathy.  In both eyes.  This started months ago.  Severity is moderate.  Duration of 6 weeks.  Since onset it is stable.  I, the attending physician,  performed the HPI with the patient and updated documentation appropriately.          Comments    F/U NPDR OU; S/P focal laser OD (04.04.19), S/P focal laser OS (03.20.19); Pt states OU VA is stable; Pt states she tolerated laser well at last visit; Pt denies floaters, denies flashes, denies wavy VA; Pt reports last CBG reading is unknown, last A1C 7.4; Pt reports CBG is stable;        Last edited by Rennis Chris, MD on 11/07/2017 11:23 AM. (History)    Pt states she tolerated laser well at last visit;    Referring physician: Darrow Bussing, MD [address] [place], [place] [address]  HISTORICAL INFORMATION:   Selected notes from the MEDICAL RECORD NUMBER Referred form Dr. Zetta Bills for concern of macular edema OS;  Ocular Hx- NPDR OU; cataract OU;  PMH- Type 2 DM; HTN   CURRENT MEDICATIONS: No current outpatient medications on file. (Ophthalmic Drugs)   No current facility-administered medications for this visit.  (Ophthalmic Drugs)   Current Outpatient Medications (Other)  Medication Sig  . aspirin 81 MG chewable tablet Chew 81 mg by mouth daily.  Marland Kitchen glipiZIDE (GLUCOTROL) 5 MG tablet Take 5 mg 2 (two) times daily before a meal by mouth.   . insulin glargine (LANTUS) 100 UNIT/ML injection Inject 24 Units into the skin at bedtime.  Marland Kitchen lisinopril-hydrochlorothiazide (PRINZIDE,ZESTORETIC) 20-25 MG tablet Take 1 tablet by mouth daily.  . metFORMIN (GLUCOPHAGE) 500 MG tablet Take 1,000 mg 2 (two) times daily with a meal by mouth.    . metoprolol succinate (TOPROL-XL) 25 MG 24 hr tablet Take 25 mg by mouth daily.  . mupirocin ointment (BACTROBAN) 2 % Apply 1 application topically 2 (two) times daily.  . naproxen (NAPROSYN) 500 MG tablet Take 500 mg 2 (two) times daily with a meal by mouth.    No current facility-administered medications for this visit.  (Other)      REVIEW OF SYSTEMS: ROS    Positive for: Endocrine, Cardiovascular, Eyes   Negative for: Constitutional, Gastrointestinal, Neurological, Skin, Genitourinary, Musculoskeletal, HENT, Respiratory, Psychiatric, Allergic/Imm, Heme/Lymph   Last edited by Concepcion Elk, COA on 11/07/2017  8:51 AM. (History)       ALLERGIES No Known Allergies  PAST MEDICAL HISTORY Past Medical History:  Diagnosis Date  . Diabetes mellitus without complication (HCC)   . Hypertension    Past Surgical History:  Procedure Laterality Date  . EYE SURGERY  2015   OS/OD  . HYSTERECTOMY ABDOMINAL WITH SALPINGECTOMY      FAMILY HISTORY Family History  Problem Relation Age of Onset  . Hypertension Mother   . Diabetes Maternal Aunt   . Breast cancer Other     SOCIAL HISTORY Social History   Tobacco Use  . Smoking status: Never Smoker  . Smokeless tobacco: Never Used  Substance Use Topics  . Alcohol use: No  . Drug use: No  OPHTHALMIC EXAM:  Base Eye Exam    Visual Acuity (Snellen - Linear)      Right Left   Dist [place] 20/25 20/25   Dist [place] 20/50 20/40   Dist ph [place] NI NI   Dist ph cc 20/30 20/25   Correction:  Glasses       Tonometry (Tonopen, 9:00 AM)      Right Left   Pressure 13 14       Pupils      Dark Light Shape React APD   Right 3 2 Round Brisk None   Left 3 2 Round Brisk None       Visual Fields (Counting fingers)      Left Right    Full Full       Extraocular Movement      Right Left    Full Full       Neuro/Psych    Oriented x3:  Yes   Mood/Affect:  Normal       Dilation    Both eyes:  1.0% Mydriacyl, 2.5%  Phenylephrine @ 9:00 AM        Slit Lamp and Fundus Exam    Slit Lamp Exam      Right Left   Lids/Lashes Dermatochalasis - upper lid Dermatochalasis - upper lid   Conjunctiva/Sclera White and quiet White and quiet   Cornea Trace Punctate epithelial erosions, Arcus Trace Punctate epithelial erosions, Arcus   Anterior Chamber Deep and quiet Deep and quiet   Iris Round and dilated, No NVI Round and dilated, No NVI   Lens 2-3+ Nuclear sclerosis, 2+ Cortical cataract, Vacuoles 2-3+ Nuclear sclerosis, 2+ Cortical cataract, Vacuoles   Vitreous Vitreous syneresis Vitreous syneresis       Fundus Exam      Right Left   Disc Normal, No NVD Normal, No NVD   C/D Ratio 0.4 0.4   Macula good foveal relfex, Retinal pigment epithelial mottling, Temporal Microaneurysms, Exudates temporal to fovea perifoveal exudates extending temporally, Microaneurysms, Good foveal reflex   Vessels Mild copper wiring Mild copper wiring   Periphery Attached, Rare MA, Temporal Early Cotton wool spots Attached, Rare MA, Temporal Early Cotton wool spots, white centered IRH at 0630 mid-zone -- mild laser changes visible        Refraction    Wearing Rx      Sphere Cylinder Axis Add   Right -0.75 +0.75 160 +2.75   Left -1.00 +0.75 025 +2.75   Type:  PAL          IMAGING AND PROCEDURES  Imaging and Procedures for 09/26/17  OCT, Retina - OU - Both Eyes       Right Eye Quality was good. Central Foveal Thickness: 225. Progression has been stable. Findings include normal foveal contour, no SRF, vitreomacular adhesion , intraretinal fluid (Interval improvement in ST IRF comparison an wide field OCT).   Left Eye Quality was good. Central Foveal Thickness: 225. Progression has been stable. Findings include normal foveal contour, no SRF, intraretinal fluid, vitreomacular adhesion  (DME / IRF just inferotemporal to fovea - stable to slightly improved).   Notes Images taken, stored on drive  Diagnosis / Impression:   DME OU, OS>OD  Clinical management:  See below  Abbreviations: NFP - Normal foveal profile. CME - cystoid macular edema. PED - pigment epithelial detachment. IRF - intraretinal fluid. SRF - subretinal fluid. EZ - ellipsoid zone. ERM - epiretinal membrane. ORA - outer retinal atrophy. ORT - outer retinal tubulation.  SRHM - subretinal hyper-reflective material                  ASSESSMENT/PLAN:    ICD-10-CM   1. Moderate nonproliferative diabetic retinopathy of both eyes with macular edema associated with type 2 diabetes mellitus (HCC) E45.4098   2. Hypertensive retinopathy of both eyes H35.033   3. Nuclear sclerosis of both eyes H25.13   4. Retinal edema H35.81 OCT, Retina - OU - Both Eyes    1. Moderate non-proliferative diabetic retinopathy, both eyes -- stable - no NV noted on exam or prior FA - Diabetic macular edema, both eyes  - FA on 12.17.18 showed leaking microaneurysms amenable to focal laser   - S/P focal laser OS (03.20.19)  - S/P focal laser OD (04.04.19)  - DME persistent but improved today OU (OD more than OS)  - BCVA remains good at 20/25 OU  - f/u in 6-8 weeks for DFE/OCT   2. Hypertensive retinopathy OU - discussed importance of tight BP control - monitor  3. Nuclear sclerosis OU - The symptoms of cataract, surgical options, and treatments and risks were discussed with patient. - discussed diagnosis and progression - under the expert care of Dr. Zetta Bills   Ophthalmic Meds Ordered this visit:  No orders of the defined types were placed in this encounter.      Return in about 8 weeks (around 01/02/2018) for F/U NPDR OU, DFE, OCT.  There are no Patient Instructions on file for this visit.   Explained the diagnoses, plan, and follow up with the patient and they expressed understanding.  Patient expressed understanding of the importance of proper follow up care.   This document serves as a record of services personally performed by Karie Chimera, MD, PhD. It was created on their behalf by Virgilio Belling, COA, a certified ophthalmic assistant. The creation of this record is the provider's dictation and/or activities during the visit.  Electronically signed by: Virgilio Belling, COA  05.15.19 11:25 AM    Karie Chimera, M.D., Ph.D. Diseases & Surgery of the Retina and Vitreous [facility] 11/07/17  I have reviewed the above documentation for accuracy and completeness, and I agree with the above. Karie Chimera, M.D., Ph.D. 11/07/17 11:25 AM    Abbreviations: M myopia (nearsighted); A astigmatism; H hyperopia (farsighted); P presbyopia; Mrx spectacle prescription;  CTL contact lenses; OD right eye; OS left eye; OU both eyes  XT exotropia; ET esotropia; PEK punctate epithelial keratitis; PEE punctate epithelial erosions; DES dry eye syndrome; MGD meibomian gland dysfunction; ATs artificial tears; PFAT's preservative free artificial tears; NSC nuclear sclerotic cataract; PSC posterior subcapsular cataract; ERM epi-retinal membrane; PVD posterior vitreous detachment; RD retinal detachment; DM diabetes mellitus; DR diabetic retinopathy; NPDR non-proliferative diabetic retinopathy; PDR proliferative diabetic retinopathy; CSME clinically significant macular edema; DME diabetic macular edema; dbh dot blot hemorrhages; CWS cotton wool spot; POAG primary open angle glaucoma; C/D cup-to-disc ratio; HVF humphrey visual field; GVF goldmann visual field; OCT optical coherence tomography; IOP intraocular pressure; BRVO Branch retinal vein occlusion; CRVO central retinal vein occlusion; CRAO central retinal artery occlusion; BRAO branch retinal artery occlusion; RT retinal tear; SB scleral buckle; PPV pars plana vitrectomy; VH Vitreous hemorrhage; PRP panretinal laser photocoagulation; IVK intravitreal kenalog; VMT vitreomacular traction; MH Macular hole;  NVD neovascularization of the disc; NVE neovascularization elsewhere;  AREDS age related eye disease study; ARMD age related macular degeneration; POAG primary open angle glaucoma; EBMD epithelial/anterior basement membrane dystrophy;  ACIOL anterior chamber intraocular lens; IOL intraocular lens; PCIOL posterior chamber intraocular lens; Phaco/IOL phacoemulsification with intraocular lens placement; [place] photorefractive keratectomy; LASIK laser assisted in situ keratomileusis; HTN hypertension; DM diabetes mellitus; COPD chronic obstructive pulmonary disease

## 2017-11-07 ENCOUNTER — Ambulatory Visit (INDEPENDENT_AMBULATORY_CARE_PROVIDER_SITE_OTHER): Payer: PPO | Admitting: Ophthalmology

## 2017-11-07 ENCOUNTER — Encounter (INDEPENDENT_AMBULATORY_CARE_PROVIDER_SITE_OTHER): Payer: Self-pay | Admitting: Ophthalmology

## 2017-11-07 DIAGNOSIS — H35033 Hypertensive retinopathy, bilateral: Secondary | ICD-10-CM

## 2017-11-07 DIAGNOSIS — E113313 Type 2 diabetes mellitus with moderate nonproliferative diabetic retinopathy with macular edema, bilateral: Secondary | ICD-10-CM

## 2017-11-07 DIAGNOSIS — H3581 Retinal edema: Secondary | ICD-10-CM | POA: Diagnosis not present

## 2017-11-07 DIAGNOSIS — H2513 Age-related nuclear cataract, bilateral: Secondary | ICD-10-CM

## 2017-12-17 NOTE — Progress Notes (Signed)
[facility] Note  12/19/2017     CHIEF COMPLAINT Patient presents for Retina Follow Up   HISTORY OF PRESENT ILLNESS: Kimberly Hammond is a 67 y.o. female who presents to the clinic today for:   HPI    Retina Follow Up    Patient presents with  Diabetic Retinopathy.  In both eyes.  This started 3 months ago.  Severity is mild.  Since onset it is stable.  I, the attending physician,  performed the HPI with the patient and updated documentation appropriately.          Comments    F/U NPDR OU. S/P focal laser OD 09/26/17 S/P focal laser OS 09/11/17. Patient states her vision has been about the same ,denies visual changes/issues.Pt states she has not checked BS recently, but she feels they are [facility]       Last edited by Bernarda Caffey, MD on 12/19/2017  8:49 AM. (History)      Referring physician: Lujean Amel, MD [address], [place] [address]  HISTORICAL INFORMATION:   Selected notes from the MEDICAL RECORD NUMBER Referred form Dr. Shirleen Schirmer for concern of macular edema OS;  Ocular Hx- NPDR OU; cataract OU;  PMH- Type 2 DM; HTN   CURRENT MEDICATIONS: No current outpatient medications on file. (Ophthalmic Drugs)   No current facility-administered medications for this visit.  (Ophthalmic Drugs)   Current Outpatient Medications (Other)  Medication Sig  . aspirin 81 MG chewable tablet Chew 81 mg by mouth daily.  Marland Kitchen glipiZIDE (GLUCOTROL) 5 MG tablet Take 5 mg 2 (two) times daily before a meal by mouth.   . insulin glargine (LANTUS) 100 UNIT/ML injection Inject 24 Units into the skin at bedtime.  Marland Kitchen lisinopril-hydrochlorothiazide (PRINZIDE,ZESTORETIC) 20-25 MG tablet Take 1 tablet by mouth daily.  . metFORMIN (GLUCOPHAGE) 500 MG tablet Take 1,000 mg 2 (two) times daily with a meal by mouth.   . metoprolol succinate (TOPROL-XL) 25 MG 24 hr tablet Take 25 mg by mouth daily.  . mupirocin ointment (BACTROBAN) 2 % Apply 1 application  topically 2 (two) times daily.  . naproxen (NAPROSYN) 500 MG tablet Take 500 mg 2 (two) times daily with a meal by mouth.    No current facility-administered medications for this visit.  (Other)      REVIEW OF SYSTEMS: ROS    Positive for: Eyes   Negative for: Constitutional, Gastrointestinal, Neurological, Skin, Genitourinary, Musculoskeletal, HENT, Cardiovascular, Respiratory, Psychiatric, Allergic/Imm, Heme/Lymph   Last edited by Zenovia Jordan, LPN on 624THL  [id number] AM. (History)       ALLERGIES No Known Allergies  PAST MEDICAL HISTORY Past Medical History:  Diagnosis Date  . Diabetes mellitus without complication ([place])   . Hypertension    Past Surgical History:  Procedure Laterality Date  . EYE SURGERY  2015   OS/OD  . HYSTERECTOMY ABDOMINAL WITH SALPINGECTOMY      FAMILY HISTORY Family History  Problem Relation Age of Onset  . Hypertension Mother   . Diabetes Maternal Aunt   . Breast cancer Other     SOCIAL HISTORY Social History   Tobacco Use  . Smoking status: Never Smoker  . Smokeless tobacco: Never Used  Substance Use Topics  . Alcohol use: No  . Drug use: No         OPHTHALMIC EXAM:  Base Eye Exam    Visual Acuity (Snellen - Linear)      Right Left  Dist [place] 20/60 -1 20/30 +2   Dist ph [place] 20/40 +1 20/25   Correction:  Glasses       Tonometry (Tonopen, 8:36 AM)      Right Left   Pressure 16 15       Pupils      Dark Light Shape React APD   Right 3 2 Round Brisk None   Left 3 2 Round Brisk None       Visual Fields (Counting fingers)      Left Right    Full Full       Extraocular Movement      Right Left    Full, Ortho Full, Ortho       Neuro/Psych    Oriented x3:  Yes   Mood/Affect:  Normal       Dilation    Both eyes:  1.0% Mydriacyl, 2.5% Phenylephrine @ 8:36 AM        Slit Lamp and Fundus Exam    Slit Lamp Exam      Right Left   Lids/Lashes Dermatochalasis - upper lid Dermatochalasis - upper lid    Conjunctiva/Sclera White and quiet White and quiet   Cornea Trace Punctate epithelial erosions, Arcus Trace Punctate epithelial erosions, Arcus   Anterior Chamber Deep and quiet Deep and quiet   Iris Round and dilated, No NVI Round and dilated, No NVI   Lens 3+ Nuclear sclerosis, 2+ Cortical cataract, Vacuoles 2-3+ Nuclear sclerosis, 2+ Cortical cataract, Vacuoles   Vitreous Vitreous syneresis Vitreous syneresis       Fundus Exam      Right Left   Disc Normal, No NVD Normal, No NVD   C/D Ratio 0.4 0.4   Macula good foveal relfex, Retinal pigment epithelial mottling, Temporal Microaneurysms, light focal laser scars Good foveal reflex, perifoveal exudates extending temporally and Microaneurysms -- improved with light focal laser scars   Vessels Mild copper wiring Mild copper wiring   Periphery Attached, Rare MA, Temporal Early Cotton wool spots Attached, Rare MA, Temporal Early Cotton wool spots, white centered IRH at 0630 mid-zone -- mild laser changes visible          IMAGING AND PROCEDURES  Imaging and Procedures for 09/26/17  OCT, Retina - OU - Both Eyes       Right Eye Quality was good. Central Foveal Thickness: 223. Progression has improved. Findings include normal foveal contour, no SRF, vitreomacular adhesion , intraretinal fluid (Interval improvement in ST IRF comparison on wide field OCT).   Left Eye Quality was good. Central Foveal Thickness: 227. Progression has improved. Findings include normal foveal contour, no SRF, intraretinal fluid, vitreomacular adhesion  (DME / IRF just inferotemporal to fovea - interval improvement).   Notes Images taken, stored on drive  Diagnosis / Impression:  DME OU, OS>OD -- improved  Clinical management:  See below  Abbreviations: NFP - Normal foveal profile. CME - cystoid macular edema. PED - pigment epithelial detachment. IRF - intraretinal fluid. SRF - subretinal fluid. EZ - ellipsoid zone. ERM - epiretinal membrane. ORA - outer  retinal atrophy. ORT - outer retinal tubulation. SRHM - subretinal hyper-reflective material                  ASSESSMENT/PLAN:    ICD-10-CM   1. Moderate nonproliferative diabetic retinopathy of both eyes with macular edema associated with type 2 diabetes mellitus (HCC) E11.3313 OCT, Retina - OU - Both Eyes  2. Hypertensive retinopathy of both eyes H35.033   3.  Nuclear sclerosis of both eyes H25.13   4. Retinal edema H35.81 OCT, Retina - OU - Both Eyes    1. Moderate non-proliferative diabetic retinopathy, both eyes -- stable - no NV noted on exam or prior FA - Diabetic macular edema, both eyes  - FA on 12.17.18 showed leaking microaneurysms amenable to focal laser   - S/P focal laser OS (03.20.19)  - S/P focal laser OD (04.04.19)  - DME essentially resolved OD, persistent but improved OS  - BCVA remains relatively stable  - discussed laser vs antiVEGF vs observation for residual DME OS -- pt wishes to observe  - f/u in 3 months for DFE/OCT   2. Hypertensive retinopathy OU - discussed importance of tight BP control - monitor  3. Nuclear sclerosis OU - The symptoms of cataract, surgical options, and treatments and risks were discussed with patient. - discussed diagnosis and progression - under the expert care of Dr. Shirleen Schirmer   Ophthalmic Meds Ordered this visit:  No orders of the defined types were placed in this encounter.      Return in about 3 months (around 03/21/2018) for F/U NPDR OU, DFE, OCT.  There are no Patient Instructions on file for this visit.   Explained the diagnoses, plan, and follow up with the patient and they expressed understanding.  Patient expressed understanding of the importance of proper follow up care.   This document serves as a record of services personally performed by Gardiner Sleeper, MD, PhD. It was created on their behalf by Catha Brow, [place], a certified ophthalmic assistant. The creation of this record is the provider's  dictation and/or activities during the visit.  Electronically signed by: Catha Brow, COA  06.25.19 9:09 AM   Gardiner Sleeper, M.D., Ph.D. Diseases & Surgery of the Retina and Vitreous [facility]   I have reviewed the above documentation for accuracy and completeness, and I agree with the above. Gardiner Sleeper, M.D., Ph.D. 12/19/17 9:09 AM    Abbreviations: M myopia (nearsighted); A astigmatism; H hyperopia (farsighted); P presbyopia; Mrx spectacle prescription;  CTL contact lenses; OD right eye; OS left eye; OU both eyes  XT exotropia; ET esotropia; PEK punctate epithelial keratitis; PEE punctate epithelial erosions; DES dry eye syndrome; MGD meibomian gland dysfunction; ATs artificial tears; PFAT's preservative free artificial tears; [place] nuclear sclerotic cataract; PSC posterior subcapsular cataract; ERM epi-retinal membrane; PVD posterior vitreous detachment; RD retinal detachment; DM diabetes mellitus; DR diabetic retinopathy; NPDR non-proliferative diabetic retinopathy; PDR proliferative diabetic retinopathy; CSME clinically significant macular edema; DME diabetic macular edema; dbh dot blot hemorrhages; CWS cotton wool spot; POAG primary open angle glaucoma; C/D cup-to-disc ratio; HVF humphrey visual field; GVF goldmann visual field; OCT optical coherence tomography; IOP intraocular pressure; BRVO Branch retinal vein occlusion; CRVO central retinal vein occlusion; CRAO central retinal artery occlusion; BRAO branch retinal artery occlusion; RT retinal tear; SB scleral buckle; PPV pars plana vitrectomy; VH Vitreous hemorrhage; PRP panretinal laser photocoagulation; IVK intravitreal kenalog; VMT vitreomacular traction; MH Macular hole;  NVD neovascularization of the disc; NVE neovascularization elsewhere; AREDS age related eye disease study; ARMD age related macular degeneration; POAG primary open angle glaucoma; EBMD epithelial/anterior basement membrane dystrophy;  ACIOL anterior chamber intraocular lens; IOL intraocular lens; PCIOL posterior chamber intraocular lens; Phaco/IOL phacoemulsification with intraocular lens placement; [place] photorefractive keratectomy; LASIK laser assisted in situ keratomileusis; HTN hypertension; DM diabetes mellitus; COPD chronic obstructive pulmonary disease

## 2017-12-19 ENCOUNTER — Encounter (INDEPENDENT_AMBULATORY_CARE_PROVIDER_SITE_OTHER): Payer: Self-pay | Admitting: Ophthalmology

## 2017-12-19 ENCOUNTER — Ambulatory Visit (INDEPENDENT_AMBULATORY_CARE_PROVIDER_SITE_OTHER): Payer: PPO | Admitting: Ophthalmology

## 2017-12-19 DIAGNOSIS — H35033 Hypertensive retinopathy, bilateral: Secondary | ICD-10-CM

## 2017-12-19 DIAGNOSIS — H2513 Age-related nuclear cataract, bilateral: Secondary | ICD-10-CM

## 2017-12-19 DIAGNOSIS — E113313 Type 2 diabetes mellitus with moderate nonproliferative diabetic retinopathy with macular edema, bilateral: Secondary | ICD-10-CM

## 2017-12-19 DIAGNOSIS — H3581 Retinal edema: Secondary | ICD-10-CM

## 2018-02-05 DIAGNOSIS — E669 Obesity, unspecified: Secondary | ICD-10-CM | POA: Diagnosis not present

## 2018-02-05 DIAGNOSIS — I10 Essential (primary) hypertension: Secondary | ICD-10-CM | POA: Diagnosis not present

## 2018-02-05 DIAGNOSIS — E1165 Type 2 diabetes mellitus with hyperglycemia: Secondary | ICD-10-CM | POA: Diagnosis not present

## 2018-02-05 DIAGNOSIS — E7800 Pure hypercholesterolemia, unspecified: Secondary | ICD-10-CM | POA: Diagnosis not present

## 2018-03-20 ENCOUNTER — Encounter (INDEPENDENT_AMBULATORY_CARE_PROVIDER_SITE_OTHER): Payer: PPO | Admitting: Ophthalmology

## 2018-03-21 NOTE — Progress Notes (Signed)
[facility] - Clinic Note  03/24/2018     CHIEF COMPLAINT Patient presents for Retina Follow Up and Diabetic Retinopathy with Macular Edema   HISTORY OF PRESENT ILLNESS: Kimberly Hammond is a 67 y.o. female who presents to the clinic today for:   HPI    Retina Follow Up    Patient presents with  Diabetic Retinopathy.  In right eye.  This started 5 months ago.  Severity is mild.  Since onset it is stable.  I, the attending physician,  performed the HPI with the patient and updated documentation appropriately.          Comments    F/U NPDR OU. Patient states it has not been any changes in her vision since last ov, denies blurred VA, floaters and flashes.Bs 95 last week ,patient does not monitor Bs qd. Pt completed PF as instructed.       Last edited by Rennis Chris, MD on 03/24/2018  9:02 AM. (History)      Referring physician: Darrow Bussing, MD [address] [place], [place] [address]  HISTORICAL INFORMATION:   Selected notes from the MEDICAL RECORD NUMBER Referred form Dr. Zetta Bills for concern of macular edema OS;  Ocular Hx- NPDR OU; cataract OU;  PMH- Type 2 DM; HTN   CURRENT MEDICATIONS: No current outpatient medications on file. (Ophthalmic Drugs)   No current facility-administered medications for this visit.  (Ophthalmic Drugs)   Current Outpatient Medications (Other)  Medication Sig  . aspirin 81 MG chewable tablet Chew 81 mg by mouth daily.  Marland Kitchen atorvastatin (LIPITOR) 10 MG tablet Take 10 mg by mouth daily.  Marland Kitchen glipiZIDE (GLUCOTROL) 5 MG tablet Take 5 mg 2 (two) times daily before a meal by mouth.   . insulin glargine (LANTUS) 100 UNIT/ML injection Inject 24 Units into the skin at bedtime.  Marland Kitchen lisinopril-hydrochlorothiazide (PRINZIDE,ZESTORETIC) 20-25 MG tablet Take 1 tablet by mouth daily.  . metFORMIN (GLUCOPHAGE) 500 MG tablet Take 1,000 mg 2 (two) times daily with a meal by mouth.   . metoprolol succinate (TOPROL-XL) 25 MG  24 hr tablet Take 25 mg by mouth daily.  . mupirocin ointment (BACTROBAN) 2 % Apply 1 application topically 2 (two) times daily.  . naproxen (NAPROSYN) 500 MG tablet Take 500 mg 2 (two) times daily with a meal by mouth.   Letta Pate VERIO test strip as directed.  Marland Kitchen RELION INSULIN SYR 0.5ML/31G 31G X 5/16" 0.5 ML MISC as directed.   No current facility-administered medications for this visit.  (Other)      REVIEW OF SYSTEMS: ROS    Positive for: Endocrine, Eyes   Negative for: Constitutional, Gastrointestinal, Neurological, Skin, Genitourinary, Musculoskeletal, HENT, Cardiovascular, Respiratory, Psychiatric, Allergic/Imm, Heme/Lymph   Last edited by Annalee Genta D on 03/24/2018  8:34 AM. (History)       ALLERGIES No Known Allergies  PAST MEDICAL HISTORY Past Medical History:  Diagnosis Date  . Diabetes mellitus without complication (HCC)   . Hypertension    Past Surgical History:  Procedure Laterality Date  . EYE SURGERY  2015   OS/OD  . HYSTERECTOMY ABDOMINAL WITH SALPINGECTOMY      FAMILY HISTORY Family History  Problem Relation Age of Onset  . Hypertension Mother   . Diabetes Maternal Aunt   . Breast cancer Other     SOCIAL HISTORY Social History   Tobacco Use  . Smoking status: Never Smoker  . Smokeless tobacco: Never Used  Substance Use  Topics  . Alcohol use: No  . Drug use: No         OPHTHALMIC EXAM:  Base Eye Exam    Visual Acuity (Snellen - Linear)      Right Left   Dist cc 20/30 20/25 -1   Dist ph cc 20/25 -1 20/20 -2   Correction:  Glasses       Tonometry (Tonopen, 8:43 AM)      Right Left   Pressure 15 14       Pupils      Dark Light Shape React APD   Right 3 2 Round Brisk None   Left 3 2 Round Brisk None       Visual Fields (Counting fingers)      Left Right    Full Full       Extraocular Movement      Right Left    Full, Ortho Full, Ortho       Neuro/Psych    Oriented x3:  Yes   Mood/Affect:  Normal        Dilation    Both eyes:  1.0% Mydriacyl, 2.5% Phenylephrine @ 8:43 AM        Slit Lamp and Fundus Exam    Slit Lamp Exam      Right Left   Lids/Lashes Dermatochalasis - upper lid Dermatochalasis - upper lid   Conjunctiva/Sclera White and quiet White and quiet   Cornea Trace Punctate epithelial erosions, Arcus Trace Punctate epithelial erosions, Arcus   Anterior Chamber Deep and quiet Deep and quiet   Iris Round and dilated, No NVI Round and dilated, No NVI   Lens 3+ Nuclear sclerosis, 2+ Cortical cataract, Vacuoles 2-3+ Nuclear sclerosis, 2+ Cortical cataract, Vacuoles   Vitreous Vitreous syneresis Vitreous syneresis       Fundus Exam      Right Left   Disc Normal, No NVD Normal, No NVD   C/D Ratio 0.4 0.35   Macula good foveal relfex, Retinal pigment epithelial mottling, Temporal Microaneurysms -- improved, light focal laser scars Blunted foveal reflex, perifoveal exudates extending temporally and Microaneurysms -- improved with light focal laser scars   Vessels Mild copper wiring, Vascular attenuation Mild copper wiring   Periphery Attached, Rare MA, Temporal Early Cotton wool spots Attached, Rare MA, Temporal Early Cotton wool spots, white centered IRH at 0630 mid-zone -- mild laser changes visible, nasal blot hemorhhage        Refraction    Wearing Rx      Sphere Cylinder Axis Add   Right -0.75 +0.75 160 +2.75   Left -1.00 +0.75 025 +2.75   Type:  PAL          IMAGING AND PROCEDURES  Imaging and Procedures for 09/26/17  OCT, Retina - OU - Both Eyes       Right Eye Quality was good. Central Foveal Thickness: 228. Progression has improved. Findings include normal foveal contour, no SRF, vitreomacular adhesion , no IRF.   Left Eye Quality was good. Central Foveal Thickness: 223. Progression has been stable. Findings include normal foveal contour, no SRF, intraretinal fluid, vitreomacular adhesion  (DME / IRF just inferotemporal to fovea - interval improvement).    Notes Images taken, stored on drive  Diagnosis / Impression:  DME OU, OS>OD -- improved  Clinical management:  See below  Abbreviations: NFP - Normal foveal profile. CME - cystoid macular edema. PED - pigment epithelial detachment. IRF - intraretinal fluid. SRF - subretinal fluid. EZ - ellipsoid  zone. ERM - epiretinal membrane. ORA - outer retinal atrophy. ORT - outer retinal tubulation. SRHM - subretinal hyper-reflective material                  ASSESSMENT/PLAN:    ICD-10-CM   1. Moderate nonproliferative diabetic retinopathy of both eyes with macular edema associated with type 2 diabetes mellitus (HCC) E11.3313 OCT, Retina - OU - Both Eyes  2. Hypertensive retinopathy of both eyes H35.033   3. Nuclear sclerosis of both eyes H25.13   4. Retinal edema H35.81 OCT, Retina - OU - Both Eyes    1. Moderate non-proliferative diabetic retinopathy, both eyes -- stable - no NV noted on exam or prior FA - Diabetic macular edema -- OD resolved, OS persistent  - FA on 12.17.18 showed leaking microaneurysms amenable to focal laser   - S/P focal laser OS (03.20.19)  - S/P focal laser OD (04.04.19)  - DME resolved OD, persistent DME OS  - BCVA remains relatively good / stable (OS 20/20)  - discussed laser vs antiVEGF vs observation for residual DME OS -- pt wishes to observe again  - f/u in 3-4 months for DFE/OCT possible repeat focal laser OS   2. Hypertensive retinopathy OU - discussed importance of tight BP control - monitor  3. Nuclear sclerosis OU - The symptoms of cataract, surgical options, and treatments and risks were discussed with patient. - discussed diagnosis and progression - under the expert care of Dr. Zetta Bills   Ophthalmic Meds Ordered this visit:  No orders of the defined types were placed in this encounter.      Return in about 4 months (around 07/24/2018) for F/U NPDR OU, DFE, OCT.  There are no Patient Instructions on file for this  visit.   Explained the diagnoses, plan, and follow up with the patient and they expressed understanding.  Patient expressed understanding of the importance of proper follow up care.   This document serves as a record of services personally performed by Karie Chimera, MD, PhD. It was created on their behalf by Virgilio Belling, COA, a certified ophthalmic assistant. The creation of this record is the provider's dictation and/or activities during the visit.  Electronically signed by: Virgilio Belling, COA  09.27.19 12:44 PM   Karie Chimera, M.D., Ph.D. Diseases & Surgery of the Retina and Vitreous [facility]   I have reviewed the above documentation for accuracy and completeness, and I agree with the above. Karie Chimera, M.D., Ph.D. 03/24/18 12:46 PM    Abbreviations: M myopia (nearsighted); A astigmatism; H hyperopia (farsighted); P presbyopia; Mrx spectacle prescription;  CTL contact lenses; OD right eye; OS left eye; OU both eyes  XT exotropia; ET esotropia; PEK punctate epithelial keratitis; PEE punctate epithelial erosions; DES dry eye syndrome; MGD meibomian gland dysfunction; ATs artificial tears; PFAT's preservative free artificial tears; [place] nuclear sclerotic cataract; PSC posterior subcapsular cataract; ERM epi-retinal membrane; PVD posterior vitreous detachment; RD retinal detachment; DM diabetes mellitus; DR diabetic retinopathy; NPDR non-proliferative diabetic retinopathy; PDR proliferative diabetic retinopathy; CSME clinically significant macular edema; DME diabetic macular edema; dbh dot blot hemorrhages; CWS cotton wool spot; POAG primary open angle glaucoma; C/D cup-to-disc ratio; HVF humphrey visual field; GVF goldmann visual field; OCT optical coherence tomography; IOP intraocular pressure; BRVO Branch retinal vein occlusion; CRVO central retinal vein occlusion; CRAO central retinal artery occlusion; BRAO branch retinal artery occlusion; RT retinal  tear; SB scleral buckle; PPV pars plana vitrectomy; VH Vitreous  hemorrhage; PRP panretinal laser photocoagulation; IVK intravitreal kenalog; VMT vitreomacular traction; MH Macular hole;  NVD neovascularization of the disc; NVE neovascularization elsewhere; AREDS age related eye disease study; ARMD age related macular degeneration; POAG primary open angle glaucoma; EBMD epithelial/anterior basement membrane dystrophy; ACIOL anterior chamber intraocular lens; IOL intraocular lens; PCIOL posterior chamber intraocular lens; Phaco/IOL phacoemulsification with intraocular lens placement; [place] photorefractive keratectomy; LASIK laser assisted in situ keratomileusis; HTN hypertension; DM diabetes mellitus; COPD chronic obstructive pulmonary disease

## 2018-03-24 ENCOUNTER — Encounter (INDEPENDENT_AMBULATORY_CARE_PROVIDER_SITE_OTHER): Payer: Self-pay | Admitting: Ophthalmology

## 2018-03-24 ENCOUNTER — Ambulatory Visit (INDEPENDENT_AMBULATORY_CARE_PROVIDER_SITE_OTHER): Payer: PPO | Admitting: Ophthalmology

## 2018-03-24 DIAGNOSIS — H2513 Age-related nuclear cataract, bilateral: Secondary | ICD-10-CM

## 2018-03-24 DIAGNOSIS — E113313 Type 2 diabetes mellitus with moderate nonproliferative diabetic retinopathy with macular edema, bilateral: Secondary | ICD-10-CM

## 2018-03-24 DIAGNOSIS — H3581 Retinal edema: Secondary | ICD-10-CM

## 2018-03-24 DIAGNOSIS — H35033 Hypertensive retinopathy, bilateral: Secondary | ICD-10-CM

## 2018-04-22 DIAGNOSIS — E113313 Type 2 diabetes mellitus with moderate nonproliferative diabetic retinopathy with macular edema, bilateral: Secondary | ICD-10-CM | POA: Diagnosis not present

## 2018-04-22 DIAGNOSIS — H2513 Age-related nuclear cataract, bilateral: Secondary | ICD-10-CM | POA: Diagnosis not present

## 2018-04-28 DIAGNOSIS — E7800 Pure hypercholesterolemia, unspecified: Secondary | ICD-10-CM | POA: Diagnosis not present

## 2018-04-28 DIAGNOSIS — I10 Essential (primary) hypertension: Secondary | ICD-10-CM | POA: Diagnosis not present

## 2018-04-28 DIAGNOSIS — E1165 Type 2 diabetes mellitus with hyperglycemia: Secondary | ICD-10-CM | POA: Diagnosis not present

## 2018-06-11 DIAGNOSIS — E669 Obesity, unspecified: Secondary | ICD-10-CM | POA: Diagnosis not present

## 2018-06-11 DIAGNOSIS — E1165 Type 2 diabetes mellitus with hyperglycemia: Secondary | ICD-10-CM | POA: Diagnosis not present

## 2018-06-11 DIAGNOSIS — E7800 Pure hypercholesterolemia, unspecified: Secondary | ICD-10-CM | POA: Diagnosis not present

## 2018-06-11 DIAGNOSIS — I10 Essential (primary) hypertension: Secondary | ICD-10-CM | POA: Diagnosis not present

## 2018-06-24 ENCOUNTER — Other Ambulatory Visit: Payer: Self-pay | Admitting: Family Medicine

## 2018-06-24 DIAGNOSIS — Z1231 Encounter for screening mammogram for malignant neoplasm of breast: Secondary | ICD-10-CM

## 2018-07-08 ENCOUNTER — Ambulatory Visit
Admission: RE | Admit: 2018-07-08 | Discharge: 2018-07-08 | Disposition: A | Discharge status: Home / Self Care | Payer: PPO | Source: Ambulatory Visit | Attending: Family Medicine | Admitting: Family Medicine

## 2018-07-08 DIAGNOSIS — Z1231 Encounter for screening mammogram for malignant neoplasm of breast: Secondary | ICD-10-CM

## 2018-07-16 DIAGNOSIS — N8111 Cystocele, midline: Secondary | ICD-10-CM | POA: Diagnosis not present

## 2018-07-22 NOTE — Progress Notes (Signed)
[facility] - Clinic Note  07/24/2018     CHIEF COMPLAINT Patient presents for Retina Follow Up   HISTORY OF PRESENT ILLNESS: Kimberly Hammond is a 68 y.o. female who presents to the clinic today for:   HPI    Retina Follow Up    Patient presents with  Diabetic Retinopathy.  In both eyes.  This started 8 months ago.  Severity is mild.  Since onset it is stable.  I, the attending physician,  performed the HPI with the patient and updated documentation appropriately.          Comments    4 MOS F/U NPDR OU. Patient states her vision has been "good",Bs has "gotten better", Bs 103 (a week ago), Pt does not monitor BS QD. Denies Hypoglycemic/Hpyerglcemicepisodes, denies flashes, floaters and ocular pain.        Last edited by Rennis Chris, MD on 07/24/2018  9:26 AM. (History)    pt states her vision is doing okay  Referring physician: Darrow Bussing, MD [address] [place], [place] [address]  HISTORICAL INFORMATION:   Selected notes from the MEDICAL RECORD NUMBER Referred form Dr. Zetta Bills for concern of macular edema OS;  Ocular Hx- NPDR OU; cataract OU;  PMH- Type 2 DM; HTN   CURRENT MEDICATIONS: No current outpatient medications on file. (Ophthalmic Drugs)   No current facility-administered medications for this visit.  (Ophthalmic Drugs)   Current Outpatient Medications (Other)  Medication Sig  . aspirin 81 MG chewable tablet Chew 81 mg by mouth daily.  Marland Kitchen atorvastatin (LIPITOR) 10 MG tablet Take 10 mg by mouth daily.  Marland Kitchen glipiZIDE (GLUCOTROL) 5 MG tablet Take 5 mg 2 (two) times daily before a meal by mouth.   . insulin glargine (LANTUS) 100 UNIT/ML injection Inject 24 Units into the skin at bedtime.  Marland Kitchen lisinopril-hydrochlorothiazide (PRINZIDE,ZESTORETIC) 20-25 MG tablet Take 1 tablet by mouth daily.  . metFORMIN (GLUCOPHAGE) 500 MG tablet Take 1,000 mg 2 (two) times daily with a meal by mouth.   . metoprolol succinate (TOPROL-XL) 25  MG 24 hr tablet Take 25 mg by mouth daily.  . mupirocin ointment (BACTROBAN) 2 % Apply 1 application topically 2 (two) times daily.  . naproxen (NAPROSYN) 500 MG tablet Take 500 mg 2 (two) times daily with a meal by mouth.   Letta Pate VERIO test strip as directed.  Marland Kitchen RELION INSULIN SYR 0.5ML/31G 31G X 5/16" 0.5 ML MISC as directed.   No current facility-administered medications for this visit.  (Other)      REVIEW OF SYSTEMS: ROS    Positive for: Endocrine, Eyes   Negative for: Constitutional, Gastrointestinal, Neurological, Skin, Genitourinary, Musculoskeletal, HENT, Cardiovascular, Respiratory, Psychiatric, Allergic/Imm, Heme/Lymph   Last edited by Eldridge Scot, LPN on 0/94/7096  9:22 AM. (History)       ALLERGIES No Known Allergies  PAST MEDICAL HISTORY Past Medical History:  Diagnosis Date  . Diabetes mellitus without complication (HCC)   . Hypertension    Past Surgical History:  Procedure Laterality Date  . EYE SURGERY  2015   OS/OD  . HYSTERECTOMY ABDOMINAL WITH SALPINGECTOMY      FAMILY HISTORY Family History  Problem Relation Age of Onset  . Hypertension Mother   . Diabetes Maternal Aunt   . Breast cancer Other     SOCIAL HISTORY Social History   Tobacco Use  . Smoking status: Never Smoker  . Smokeless tobacco: Never Used  Substance Use Topics  .  Alcohol use: No  . Drug use: No         OPHTHALMIC EXAM:  Base Eye Exam    Visual Acuity (Snellen - Linear)      Right Left   Dist cc 20/30 +2 20/30 -2   Dist ph cc NI NI   Correction:  Glasses       Tonometry (Tonopen, 9:17 AM)      Right Left   Pressure 14 16       Pupils      Dark Light Shape React APD   Right 3 2 Round Brisk None   Left 3 2 Round Brisk None       Visual Fields (Counting fingers)      Left Right    Full Full       Extraocular Movement      Right Left    Full, Ortho Full, Ortho       Neuro/Psych    Oriented x3:  Yes   Mood/Affect:  Normal        Dilation    Both eyes:  1.0% Mydriacyl, 2.5% Phenylephrine @ 9:17 AM        Slit Lamp and Fundus Exam    Slit Lamp Exam      Right Left   Lids/Lashes Dermatochalasis - upper lid, Meibomian gland dysfunction Dermatochalasis - upper lid, Meibomian gland dysfunction   Conjunctiva/Sclera White and quiet White and quiet   Cornea Trace Punctate epithelial erosions, Arcus Trace Punctate epithelial erosions, Arcus   Anterior Chamber Deep and quiet Deep and quiet   Iris Round and dilated, No NVI Round and dilated, No NVI   Lens 3+ Nuclear sclerosis, 2+ Cortical cataract, Vacuoles 3+ Nuclear sclerosis, 2+ Cortical cataract, Vacuoles   Vitreous Vitreous syneresis Vitreous syneresis       Fundus Exam      Right Left   Disc Normal, No NVD Normal, No NVD   C/D Ratio 0.4 0.35   Macula good foveal relfex, Retinal pigment epithelial mottling, rare Microaneurysms, light focal laser scars good foveal reflex, improved exudates and Microaneurysms -- improved with light focal laser scars   Vessels Mild copper wiring, Vascular attenuation Mild copper wiring   Periphery Attached, Rare MA, Temporal Early Cotton wool spots Attached, Rare MA, Temporal Early Cotton wool spots, white centered IRH at 0630 mid-zone -- mild laser changes visible, nasal blot hemorhhage          IMAGING AND PROCEDURES  Imaging and Procedures for 09/26/17  OCT, Retina - OU - Both Eyes       Right Eye Quality was good. Central Foveal Thickness: 223. Progression has been stable. Findings include normal foveal contour, no SRF, no IRF.   Left Eye Quality was good. Central Foveal Thickness: 220. Progression has been stable. Findings include normal foveal contour, no SRF, intraretinal fluid, vitreomacular adhesion  (Stable IRF just inferotemporal to fovea ).   Notes Images taken, stored on drive  Diagnosis / Impression:  DME OU, OS>OD -- stably improved  Clinical management:  See below  Abbreviations: NFP - Normal foveal  profile. CME - cystoid macular edema. PED - pigment epithelial detachment. IRF - intraretinal fluid. SRF - subretinal fluid. EZ - ellipsoid zone. ERM - epiretinal membrane. ORA - outer retinal atrophy. ORT - outer retinal tubulation. SRHM - subretinal hyper-reflective material                  ASSESSMENT/PLAN:    ICD-10-CM   1. Moderate nonproliferative  diabetic retinopathy of both eyes with macular edema associated with type 2 diabetes mellitus (HCC) [id number]   2. Hypertensive retinopathy of both eyes H35.033   3. Nuclear sclerosis of both eyes H25.13   4. Retinal edema H35.81 OCT, Retina - OU - Both Eyes    1. Moderate non-proliferative diabetic retinopathy, both eyes -- stable - no NV noted on exam or prior FA - Diabetic macular edema -- OD resolved, OS persistent  - FA on 12.17.18 showed leaking microaneurysms amenable to focal laser   - S/P focal laser OS (03.20.19)  - S/P focal laser OD (04.04.19)  - DME resolved OD, persistent DME OS  - BCVA remains relatively good / stable (OS 20/30)  - discussed laser vs antiVEGF vs observation for residual DME OS -- pt wishes to observe again  - f/u in 4 months for DFE/OCT possible repeat focal laser OS   2. Hypertensive retinopathy OU - discussed importance of tight BP control - monitor  3. Nuclear sclerosis OU - The symptoms of cataract, surgical options, and treatments and risks were discussed with patient. - discussed diagnosis and progression -under the expert care of Dr. Zetta Bills. [place]    Ophthalmic Meds Ordered this visit:  No orders of the defined types were placed in this encounter.      Return in about 4 months (around 11/22/2018) for f/u NPDR OU, DFE, OCT.  There are no Patient Instructions on file for this visit.   Explained the diagnoses, plan, and follow up with the patient and they expressed understanding.  Patient expressed understanding of the importance of proper follow up care.   This document serves as a  record of services personally performed by Karie ChimeraBrian G. Jamaree Hosier, MD, PhD. It was created on their behalf by Laurian BrimAmanda Brown, OA, an ophthalmic assistant. The creation of this record is the provider's dictation and/or activities during the visit.    Electronically signed by: Laurian BrimAmanda Brown, OA  01.28.2020 9:48 AM    Karie ChimeraBrian G. Breeanne Oblinger, M.D., Ph.D. Diseases & Surgery of the Retina and Vitreous [facility]  I have reviewed the above documentation for accuracy and completeness, and I agree with the above. Karie ChimeraBrian G. Junia Nygren, M.D., Ph.D. 07/24/18 9:49 AM    Abbreviations: M myopia (nearsighted); A astigmatism; H hyperopia (farsighted); P presbyopia; Mrx spectacle prescription;  CTL contact lenses; OD right eye; OS left eye; OU both eyes  XT exotropia; ET esotropia; PEK punctate epithelial keratitis; PEE punctate epithelial erosions; DES dry eye syndrome; MGD meibomian gland dysfunction; ATs artificial tears; PFAT's preservative free artificial tears; NSC nuclear sclerotic cataract; PSC posterior subcapsular cataract; ERM epi-retinal membrane; PVD posterior vitreous detachment; RD retinal detachment; DM diabetes mellitus; DR diabetic retinopathy; NPDR non-proliferative diabetic retinopathy; PDR proliferative diabetic retinopathy; CSME clinically significant macular edema; DME diabetic macular edema; dbh dot blot hemorrhages; CWS cotton wool spot; POAG primary open angle glaucoma; C/D cup-to-disc ratio; HVF humphrey visual field; GVF goldmann visual field; OCT optical coherence tomography; IOP intraocular pressure; BRVO Branch retinal vein occlusion; CRVO central retinal vein occlusion; CRAO central retinal artery occlusion; BRAO branch retinal artery occlusion; RT retinal tear; SB scleral buckle; PPV pars plana vitrectomy; VH Vitreous hemorrhage; PRP panretinal laser photocoagulation; IVK intravitreal kenalog; VMT vitreomacular traction; MH Macular hole;  NVD neovascularization of the disc; NVE  neovascularization elsewhere; AREDS age related eye disease study; ARMD age related macular degeneration; POAG primary open angle glaucoma; EBMD epithelial/anterior basement membrane dystrophy; ACIOL anterior chamber intraocular lens; IOL intraocular lens;  PCIOL posterior chamber intraocular lens; Phaco/IOL phacoemulsification with intraocular lens placement; [place] photorefractive keratectomy; LASIK laser assisted in situ keratomileusis; HTN hypertension; DM diabetes mellitus; COPD chronic obstructive pulmonary disease

## 2018-07-24 ENCOUNTER — Encounter (INDEPENDENT_AMBULATORY_CARE_PROVIDER_SITE_OTHER): Payer: Self-pay | Admitting: Ophthalmology

## 2018-07-24 ENCOUNTER — Ambulatory Visit (INDEPENDENT_AMBULATORY_CARE_PROVIDER_SITE_OTHER): Payer: HMO | Admitting: Ophthalmology

## 2018-07-24 DIAGNOSIS — E113313 Type 2 diabetes mellitus with moderate nonproliferative diabetic retinopathy with macular edema, bilateral: Secondary | ICD-10-CM

## 2018-07-24 DIAGNOSIS — H35033 Hypertensive retinopathy, bilateral: Secondary | ICD-10-CM | POA: Diagnosis not present

## 2018-07-24 DIAGNOSIS — H2513 Age-related nuclear cataract, bilateral: Secondary | ICD-10-CM

## 2018-07-24 DIAGNOSIS — H3581 Retinal edema: Secondary | ICD-10-CM

## 2018-08-11 ENCOUNTER — Other Ambulatory Visit: Payer: Self-pay

## 2018-08-11 NOTE — Patient Outreach (Signed)
  [facility]) Care Management Chronic Special Needs Program   08/11/2018  Name: Kimberly Hammond, DOB: 05/16/1951  MRN: [id number]  The client was discussed in today's interdisciplinary care team meeting.  The following issues were discussed:  Client's needs, Key risk triggers/risk stratification, Care Plan, Coordination of care and Issues/barriers to care  Participants present: Jodean Lima, RNCM; Tawni Carnes, RNCM; Kathyrn Sheriff, RNCM  Recommendations:  Send Hypertension education  Plan:  RNCM will call within the next 2-4 months.  Kathyrn Sheriff, RN, MSN, [facility] Chronic Care Management Coordinator [facility] [phone]

## 2018-10-16 ENCOUNTER — Other Ambulatory Visit: Payer: Self-pay

## 2018-10-16 NOTE — Patient Outreach (Signed)
  [facility]) Care Management Chronic Special Needs Program  10/16/2018  Name: TARNESHA FENOGLIO DOB: 09/08/1950  MRN: [id number]  Ms. Dicey Summa is enrolled in a Chronic Special Needs Plan.Marland Kitchen RNCM called to follow up and review individualized care plan.Client answered the phone stating it was not a good time and request call back.  RNCM will follow up call in 1-2 business days.  Kathyrn Sheriff, RN, MSN, [facility] Chronic Care Management Coordinator [facility] [phone]

## 2018-10-17 ENCOUNTER — Other Ambulatory Visit: Payer: Self-pay

## 2018-10-17 NOTE — Patient Outreach (Signed)
  [facility]) Care Management Chronic Special Needs Program    10/17/2018  Name: Kimberly Hammond, DOB: 1951/03/16  MRN: [id number]   Ms. Kimberly Hammond is enrolled in a chronic special needs plan for Diabetes. Marland Kitchen RNCM called to follow up and review individualized care plan. RNCM called x2 phone pick up and cut off. Unable to leave message.  Plan: RNCM will follow up in 1-2 weeks.  Kathyrn Sheriff, RN, MSN, [facility] Chronic Care Management Coordinator [facility] [phone]

## 2018-10-22 DIAGNOSIS — E1165 Type 2 diabetes mellitus with hyperglycemia: Secondary | ICD-10-CM | POA: Diagnosis not present

## 2018-10-22 DIAGNOSIS — Z794 Long term (current) use of insulin: Secondary | ICD-10-CM | POA: Diagnosis not present

## 2018-10-22 DIAGNOSIS — R002 Palpitations: Secondary | ICD-10-CM | POA: Diagnosis not present

## 2018-10-22 DIAGNOSIS — E7800 Pure hypercholesterolemia, unspecified: Secondary | ICD-10-CM | POA: Diagnosis not present

## 2018-10-23 ENCOUNTER — Other Ambulatory Visit: Payer: Self-pay

## 2018-10-23 NOTE — Patient Outreach (Signed)
  [facility]) Care Management Chronic Special Needs Program  10/23/2018  Name: YUVAL WELDING DOB: 1951/01/22  MRN: [id number]  Ms. Peneloperose Soderman is enrolled in a chronic special needs plan for Diabetes. Chronic Care Management Coordinator telephoned client to review health risk assessment and to develop individualized care plan.  Introduced the chronic care management program, importance of client participation, and taking their care plan to all provider appointments and inpatient facilities.    Subjective: client reports a history of DM and HTN. She acknowledges that does not know her A1C level and she does not check her blood sugar as recommended by provider. She states she can tell when her blood sugar a low and will usually treat herself with juice. Client denies medication management issues and states she is able to afford her medication.   Goals Addressed            This Visit's Progress   . Client understands the importance of follow-up with providers by attending scheduled visits   On track   . COMPLETED: Client will use Assistive Devices as needed and verbalize understanding of device use      . Client will verbalize knowledge of diabetes self-management as evidenced by Hgb A1C <7 or as defined by provider.       Diabetes self management actions:  Glucose monitoring per provider recommendations  Perform Quality checks on blood meter  Eat Healthy  Check feet daily  Visit provider every 3-6 months as directed  Hbg A1C level every 3-6 months.  Eye Exam yearly    . Client will verbalize knowledge of self management of Hypertension as evidences by BP reading of 140/90 or less; or as defined by provider   On track   . HEMOGLOBIN A1C < 7.0       Diabetes self management actions: Glucose monitoring per provider recommendations Perform Quality checks on blood meter Eat Healthy Check feet daily Visit provider every 3-6 months as directed Hbg A1C level every  3-6 months. Eye Exam yearly    . Maintain timely refills of diabetic medication as prescribed within the year .   On track   . Obtain annual  Lipid Profile, LDL-C   On track   . Obtain Annual Eye (retinal)  Exam    On track   . Obtain Annual Foot Exam   On track   . Obtain annual screen for micro albuminuria (urine) , nephropathy (kidney problems)   On track   . Obtain Hemoglobin A1C at least 2 times per year   On track   . Patient Stated       Check blood sugar at least three times per week.     . Visit Primary Care Provider or Endocrinologist at least 2 times per year    On track     COVID precautions discussed. RNCM reinforced availability of 24 hour nurse advice line. Client encouraged to call health care concierge for any benefits questions. RNCM encouraged client to call RNCM as needed.   Client reports she did not received initial packet. RNCM will resend.  Plan:  Send successful outreach letter with a copy of their individualized care plan, Send individual care plan to provider and Send educational material. Chronic care management coordination will outreach in 7 months.   Kathyrn Sheriff, RN, MSN, [facility] Chronic Care Management Coordinator [facility] [phone]

## 2018-11-05 DIAGNOSIS — Z0001 Encounter for general adult medical examination with abnormal findings: Secondary | ICD-10-CM | POA: Diagnosis not present

## 2018-11-24 ENCOUNTER — Encounter (INDEPENDENT_AMBULATORY_CARE_PROVIDER_SITE_OTHER): Payer: HMO | Admitting: Ophthalmology

## 2018-12-10 DIAGNOSIS — I10 Essential (primary) hypertension: Secondary | ICD-10-CM | POA: Diagnosis not present

## 2018-12-10 DIAGNOSIS — E1165 Type 2 diabetes mellitus with hyperglycemia: Secondary | ICD-10-CM | POA: Diagnosis not present

## 2018-12-10 DIAGNOSIS — E669 Obesity, unspecified: Secondary | ICD-10-CM | POA: Diagnosis not present

## 2018-12-10 DIAGNOSIS — E7800 Pure hypercholesterolemia, unspecified: Secondary | ICD-10-CM | POA: Diagnosis not present

## 2019-01-15 DIAGNOSIS — Z1211 Encounter for screening for malignant neoplasm of colon: Secondary | ICD-10-CM | POA: Diagnosis not present

## 2019-01-15 DIAGNOSIS — Z8371 Family history of colonic polyps: Secondary | ICD-10-CM | POA: Diagnosis not present

## 2019-01-19 NOTE — Progress Notes (Signed)
[facility] Note  01/21/2019     CHIEF COMPLAINT Patient presents for Retina Follow Up   HISTORY OF PRESENT ILLNESS: Kimberly Hammond is a 68 y.o. female who presents to the clinic today for:   HPI    Retina Follow Up    Patient presents with  Diabetic Retinopathy.  In both eyes.  This started years ago.  Severity is moderate.  Duration of 6 months.  Since onset it is stable.  I, the attending physician,  performed the HPI with the patient and updated documentation appropriately.          Comments    68 y/o female pt here for 6 mo f/u for NPDR w/mac edema OU.  No noticed change in [place] OU.  Denies pain, flashes, floaters.  No gtts.  BS 94 1 wk ago.  A1C unknown.       Last edited by Bernarda Caffey, MD on 01/21/2019  9:19 AM. (History)    pt states her vision is doing okay  Referring physician: Warden Fillers, [address],  [place] [address]  HISTORICAL INFORMATION:   Selected notes from the MEDICAL RECORD NUMBER Referred form Dr. Shirleen Schirmer for concern of macular edema OS;  Ocular Hx- NPDR OU; cataract OU;  PMH- Type 2 DM; HTN   CURRENT MEDICATIONS: No current outpatient medications on file. (Ophthalmic Drugs)   No current facility-administered medications for this visit.  (Ophthalmic Drugs)   Current Outpatient Medications (Other)  Medication Sig  . aspirin 81 MG chewable tablet Chew 81 mg by mouth daily.  Marland Kitchen atorvastatin (LIPITOR) 10 MG tablet Take 10 mg by mouth daily.  Marland Kitchen glipiZIDE (GLUCOTROL XL) 5 MG 24 hr tablet TAKE 1 TABLET BY MOUTH TWICE DAILY FOR 90 DAYS  . insulin glargine (LANTUS) 100 UNIT/ML injection Inject 24 Units into the skin at bedtime.  Marland Kitchen lisinopril-hydrochlorothiazide (PRINZIDE,ZESTORETIC) 20-25 MG tablet Take 1 tablet by mouth daily.  . metFORMIN (GLUCOPHAGE) 1000 MG tablet TAKE 1 TABLET BY MOUTH TWICE DAILY WITH A MEAL  . metoprolol succinate (TOPROL-XL) 25 MG 24 hr tablet Take 25 mg by mouth daily.   . mupirocin ointment (BACTROBAN) 2 % Apply 1 application topically 2 (two) times daily.  . naproxen (NAPROSYN) 500 MG tablet Take 500 mg 2 (two) times daily with a meal by mouth.   Glory Rosebush VERIO test strip as directed.  . polyethylene glycol-electrolytes (NULYTELY/GOLYTELY) 420 g solution See admin instructions.  Daryll Brod INSULIN SYR 0.5ML/31G 31G X 5/16" 0.5 ML MISC as directed.  Marland Kitchen glipiZIDE (GLUCOTROL) 5 MG tablet Take 5 mg 2 (two) times daily before a meal by mouth.   . metFORMIN (GLUCOPHAGE) 500 MG tablet Take 1,000 mg 2 (two) times daily with a meal by mouth.    No current facility-administered medications for this visit.  (Other)      REVIEW OF SYSTEMS: ROS    Positive for: Endocrine, Eyes   Negative for: Constitutional, Gastrointestinal, Neurological, Skin, Genitourinary, Musculoskeletal, HENT, Cardiovascular, Respiratory, Psychiatric, Allergic/Imm, Heme/Lymph   Last edited by Matthew Folks, COA on 01/21/2019  8:48 AM. (History)       ALLERGIES No Known Allergies  PAST MEDICAL HISTORY Past Medical History:  Diagnosis Date  . Cataract    NS OU  . Diabetes mellitus without complication ([place])   . Diabetic retinopathy ([place])    NPDR OU  . Hypertension   . Hypertensive retinopathy    OU  Past Surgical History:  Procedure Laterality Date  . EYE SURGERY  2015   OS/OD  . HYSTERECTOMY ABDOMINAL WITH SALPINGECTOMY      FAMILY HISTORY Family History  Problem Relation Age of Onset  . Hypertension Mother   . Diabetes Maternal Aunt   . Breast cancer Other     SOCIAL HISTORY Social History   Tobacco Use  . Smoking status: Never Smoker  . Smokeless tobacco: Never Used  Substance Use Topics  . Alcohol use: No  . Drug use: No         OPHTHALMIC EXAM:  Base Eye Exam    Visual Acuity (Snellen - Linear)      Right Left   Dist cc 20/40 + 20/30 -2   Dist ph cc 20/30 -2 20/25 -2   Correction: Glasses       Tonometry (Tonopen, 8:52 AM)      Right Left    Pressure 14 12       Pupils      Dark Light Shape React APD   Right 3 2 Round Brisk None   Left 3 2 Round Brisk None       Visual Fields      Left Right    Full Full       Extraocular Movement      Right Left    Full, Ortho Full, Ortho       Neuro/Psych    Oriented x3: Yes   Mood/Affect: Normal       Dilation    Both eyes: 1.0% Mydriacyl, 2.5% Phenylephrine @ 8:52 AM        Slit Lamp and Fundus Exam    Slit Lamp Exam      Right Left   Lids/Lashes Dermatochalasis - upper lid, Meibomian gland dysfunction Dermatochalasis - upper lid, Meibomian gland dysfunction   Conjunctiva/Sclera White and quiet White and quiet   Cornea Trace Punctate epithelial erosions, Arcus Trace Punctate epithelial erosions, Arcus   Anterior Chamber Deep and quiet Deep and quiet   Iris Round and dilated, No NVI Round and dilated, No NVI   Lens 2-3+ Nuclear sclerosis, 2+ Cortical cataract, Vacuoles 2-3+ Nuclear sclerosis, 2+ Cortical cataract, Vacuoles   Vitreous Vitreous syneresis Vitreous syneresis       Fundus Exam      Right Left   Disc Normal, No NVD Normal, No NVD   C/D Ratio 0.4 0.35   Macula good foveal relfex, Retinal pigment epithelial mottling, rare Microaneurysms, light focal laser scars good foveal reflex, improved focal exudates and MA's -- improved with light focal laser scars inf/temp macula   Vessels Mild copper wiring, Vascular attenuation Mild copper wiring   Periphery Attached, Rare MA Attached, cluster blot hemes temporal periphery, scattered RPE changes        Refraction    Wearing Rx      Sphere Cylinder Axis Add   Right -0.75 +0.75 160 +2.50   Left -1.00 +0.75 025 +2.50   Age: 89yr   Type: PAL       Manifest Refraction      Sphere Cylinder Axis Dist VA   Right -0.75 +0.75 160 20/40+   Left -1.00 +0.75 025 20/30-          IMAGING AND PROCEDURES  Imaging and Procedures for 09/26/17  OCT, Retina - OU - Both Eyes       Right Eye Quality was good.  Central Foveal Thickness: 223. Progression has been stable. Findings include normal  foveal contour, no SRF, no IRF (Trace non central cystic changes temporal macula).   Left Eye Quality was good. Central Foveal Thickness: 223. Progression has worsened. Findings include normal foveal contour, no SRF, intraretinal fluid, vitreomacular adhesion , intraretinal hyper-reflective material (Mild interval increase in IRF inf/temp macula).   Notes Images taken, stored on drive  Diagnosis / Impression:  DME OU, OS>OD OD- stably improved OS- Mild interval increase in IT IRF OS  Clinical management:  See below  Abbreviations: NFP - Normal foveal profile. CME - cystoid macular edema. PED - pigment epithelial detachment. IRF - intraretinal fluid. SRF - subretinal fluid. EZ - ellipsoid zone. ERM - epiretinal membrane. ORA - outer retinal atrophy. ORT - outer retinal tubulation. SRHM - subretinal hyper-reflective material                  ASSESSMENT/PLAN:    ICD-10-CM   1. 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 Retinal edema  H35.81 OCT, Retina - OU - Both Eyes  3. Essential hypertension  I10   4. Hypertensive retinopathy of both eyes  H35.033   5. Combined forms of age-related cataract of both eyes  H25.813     1,2. Moderate non-proliferative diabetic retinopathy, both eyes -- stable - no NV noted on exam or prior FA - Diabetic macular edema -- OD resolved, OS persistent and slightly increased  - FA on 12.17.18 showed leaking microaneurysms amenable to focal laser   - S/P focal laser OS (03.20.19)  - S/P focal laser OD (04.04.19)  - DME resolved OD, persistent DME OS  - BCVA remains relatively good / stable (OS 20/25-2)  - discussed focal laser vs antiVEGF vs observation for residual DME OS -- pt wishes to observe again  - f/u in 3 months for DFE/OCT/FA (Transit OS)   3,4.  Hypertensive retinopathy OU - discussed importance of tight BP control - monitor  5. Nuclear sclerosis OU - The symptoms of cataract, surgical options, and treatments and risks were discussed with patient. - discussed diagnosis and progression - under the management of expert surgeon, Dr. Shirleen Schirmer    Ophthalmic Meds Ordered this visit:  No orders of the defined types were placed in this encounter.      Return in about 3 months (around 04/23/2019) for Dilated exam/OCT/FA--transit OS.  There are no Patient Instructions on file for this visit.   Explained the diagnoses, plan, and follow up with the patient and they expressed understanding.  Patient expressed understanding of the importance of proper follow up care.   This document serves as a record of services personally performed by Gardiner Sleeper, MD, PhD. It was created on their behalf by Ernest Mallick, OA, an ophthalmic assistant. The creation of this record is the provider's dictation and/or activities during the visit.    Electronically signed by: Ernest Mallick, OA  07.27.2020 1:07 PM    Gardiner Sleeper, M.D., Ph.D. Diseases & Surgery of the Retina and Vitreous [facility]  I have reviewed the above documentation for accuracy and completeness, and I agree with the above. Gardiner Sleeper, M.D., Ph.D. 01/21/19 1:07 PM   Abbreviations: M myopia (nearsighted); A astigmatism; H hyperopia (farsighted); P presbyopia; Mrx spectacle prescription;  CTL contact lenses; OD right eye; OS left eye; OU both eyes  XT exotropia; ET esotropia; PEK punctate epithelial keratitis; PEE punctate epithelial erosions; DES dry  eye syndrome; MGD meibomian gland dysfunction; ATs artificial tears; PFAT's preservative free artificial tears; [place] nuclear sclerotic cataract; PSC posterior subcapsular cataract; ERM epi-retinal membrane; PVD posterior vitreous detachment; RD retinal detachment; DM diabetes mellitus; DR diabetic  retinopathy; NPDR non-proliferative diabetic retinopathy; PDR proliferative diabetic retinopathy; CSME clinically significant macular edema; DME diabetic macular edema; dbh dot blot hemorrhages; CWS cotton wool spot; POAG primary open angle glaucoma; C/D cup-to-disc ratio; HVF humphrey visual field; GVF goldmann visual field; OCT optical coherence tomography; IOP intraocular pressure; BRVO Branch retinal vein occlusion; CRVO central retinal vein occlusion; CRAO central retinal artery occlusion; BRAO branch retinal artery occlusion; RT retinal tear; SB scleral buckle; PPV pars plana vitrectomy; VH Vitreous hemorrhage; PRP panretinal laser photocoagulation; IVK intravitreal kenalog; VMT vitreomacular traction; MH Macular hole;  NVD neovascularization of the disc; NVE neovascularization elsewhere; AREDS age related eye disease study; ARMD age related macular degeneration; POAG primary open angle glaucoma; EBMD epithelial/anterior basement membrane dystrophy; ACIOL anterior chamber intraocular lens; IOL intraocular lens; PCIOL posterior chamber intraocular lens; Phaco/IOL phacoemulsification with intraocular lens placement; [place] photorefractive keratectomy; LASIK laser assisted in situ keratomileusis; HTN hypertension; DM diabetes mellitus; COPD chronic obstructive pulmonary disease

## 2019-01-20 DIAGNOSIS — K5900 Constipation, unspecified: Secondary | ICD-10-CM | POA: Diagnosis not present

## 2019-01-20 DIAGNOSIS — N8111 Cystocele, midline: Secondary | ICD-10-CM | POA: Diagnosis not present

## 2019-01-20 DIAGNOSIS — Z01419 Encounter for gynecological examination (general) (routine) without abnormal findings: Secondary | ICD-10-CM | POA: Diagnosis not present

## 2019-01-20 DIAGNOSIS — R102 Pelvic and perineal pain: Secondary | ICD-10-CM | POA: Diagnosis not present

## 2019-01-21 ENCOUNTER — Encounter (INDEPENDENT_AMBULATORY_CARE_PROVIDER_SITE_OTHER): Payer: Self-pay | Admitting: Ophthalmology

## 2019-01-21 ENCOUNTER — Other Ambulatory Visit: Payer: Self-pay

## 2019-01-21 ENCOUNTER — Ambulatory Visit (INDEPENDENT_AMBULATORY_CARE_PROVIDER_SITE_OTHER): Payer: HMO | Admitting: Ophthalmology

## 2019-01-21 DIAGNOSIS — H35033 Hypertensive retinopathy, bilateral: Secondary | ICD-10-CM | POA: Diagnosis not present

## 2019-01-21 DIAGNOSIS — I10 Essential (primary) hypertension: Secondary | ICD-10-CM | POA: Diagnosis not present

## 2019-01-21 DIAGNOSIS — H25813 Combined forms of age-related cataract, bilateral: Secondary | ICD-10-CM | POA: Diagnosis not present

## 2019-01-21 DIAGNOSIS — H3581 Retinal edema: Secondary | ICD-10-CM | POA: Diagnosis not present

## 2019-01-21 DIAGNOSIS — E113313 Type 2 diabetes mellitus with moderate nonproliferative diabetic retinopathy with macular edema, bilateral: Secondary | ICD-10-CM

## 2019-02-02 DIAGNOSIS — R102 Pelvic and perineal pain: Secondary | ICD-10-CM | POA: Diagnosis not present

## 2019-04-20 NOTE — Progress Notes (Addendum)
[facility] - Clinic Note  04/23/2019     CHIEF COMPLAINT Patient presents for Retina Follow Up   HISTORY OF PRESENT ILLNESS: Kimberly Hammond is a 68 y.o. female who presents to the clinic today for:   HPI    Retina Follow Up    Patient presents with  Diabetic Retinopathy.  In both eyes.  Severity is moderate.  Duration of 3 months.  Since onset it is stable.  I, the attending physician,  performed the HPI with the patient and updated documentation appropriately.          Comments    Patient states vision the same OU. BS was 126 this am. Last a1c unknown. Goes to see primary care doctor next month. May have a1c checked then.        Last edited by Rennis Chris, MD on 04/23/2019 10:38 AM. (History)    pt states is doing well today, she states she is taking care of her BS and BP  Referring physician: Sallye Lat, MD [address] [place],  [place] [address]  HISTORICAL INFORMATION:   Selected notes from the MEDICAL RECORD NUMBER Referred form Dr. Zetta Bills for concern of macular edema OS;  Ocular Hx- NPDR OU; cataract OU;  PMH- Type 2 DM; HTN   CURRENT MEDICATIONS: No current outpatient medications on file. (Ophthalmic Drugs)   No current facility-administered medications for this visit.  (Ophthalmic Drugs)   Current Outpatient Medications (Other)  Medication Sig  . aspirin 81 MG chewable tablet Chew 81 mg by mouth daily.  Marland Kitchen atorvastatin (LIPITOR) 10 MG tablet Take 10 mg by mouth daily.  Marland Kitchen glipiZIDE (GLUCOTROL) 5 MG tablet Take 5 mg 2 (two) times daily before a meal by mouth.   . insulin glargine (LANTUS) 100 UNIT/ML injection Inject 24 Units into the skin at bedtime.  Marland Kitchen lisinopril-hydrochlorothiazide (PRINZIDE,ZESTORETIC) 20-25 MG tablet Take 1 tablet by mouth daily.  . metFORMIN (GLUCOPHAGE) 1000 MG tablet TAKE 1 TABLET BY MOUTH TWICE DAILY WITH A MEAL  . metoprolol succinate (TOPROL-XL) 25 MG 24 hr tablet Take 25 mg by mouth daily.   . mupirocin ointment (BACTROBAN) 2 % Apply 1 application topically 2 (two) times daily.  . naproxen (NAPROSYN) 500 MG tablet Take 500 mg 2 (two) times daily with a meal by mouth.   Letta Pate VERIO test strip as directed.  . polyethylene glycol-electrolytes (NULYTELY/GOLYTELY) 420 g solution See admin instructions.  Tana Conch INSULIN SYR 0.5ML/31G 31G X 5/16" 0.5 ML MISC as directed.  Marland Kitchen glipiZIDE (GLUCOTROL XL) 5 MG 24 hr tablet TAKE 1 TABLET BY MOUTH TWICE DAILY FOR 90 DAYS  . metFORMIN (GLUCOPHAGE) 500 MG tablet Take 1,000 mg 2 (two) times daily with a meal by mouth.    No current facility-administered medications for this visit.  (Other)      REVIEW OF SYSTEMS: ROS    Positive for: Endocrine, Eyes   Negative for: Constitutional, Gastrointestinal, Neurological, Skin, Genitourinary, Musculoskeletal, HENT, Cardiovascular, Respiratory, Psychiatric, Allergic/Imm, Heme/Lymph   Last edited by Annalee Genta D, COT on 04/23/2019  9:01 AM. (History)       ALLERGIES No Known Allergies  PAST MEDICAL HISTORY Past Medical History:  Diagnosis Date  . Cataract    NS OU  . Diabetes mellitus without complication ([facility])   . Diabetic retinopathy ([facility])    NPDR OU  . Hypertension   . Hypertensive retinopathy    OU   Past Surgical History:  Procedure  Laterality Date  . EYE SURGERY  2015   OS/OD  . HYSTERECTOMY ABDOMINAL WITH SALPINGECTOMY      FAMILY HISTORY Family History  Problem Relation Age of Onset  . Hypertension Mother   . Diabetes Maternal Aunt   . Breast cancer Other     SOCIAL HISTORY Social History   Tobacco Use  . Smoking status: Never Smoker  . Smokeless tobacco: Never Used  Substance Use Topics  . Alcohol use: No  . Drug use: No         OPHTHALMIC EXAM:  Base Eye Exam    Visual Acuity (Snellen - Linear)      Right Left   Dist [place] 20/40 -2 20/25 -2   Dist ph [place] 20/30 -1 20/25   Correction: Glasses       Tonometry (Tonopen, 9:16 AM)      Right Left    Pressure 15 13       Pupils      Dark Light Shape React APD   Right 3 2 Round Slow None   Left 4 3 Round Slow None       Visual Fields (Counting fingers)      Left Right    Full Full       Extraocular Movement      Right Left    Full, Ortho Full, Ortho       Neuro/Psych    Oriented x3: Yes   Mood/Affect: Normal       Dilation    Both eyes: 1.0% Mydriacyl, 2.5% Phenylephrine @ 9:16 AM        Slit Lamp and Fundus Exam    Slit Lamp Exam      Right Left   Lids/Lashes Dermatochalasis - upper lid, Meibomian gland dysfunction Dermatochalasis - upper lid, Meibomian gland dysfunction   Conjunctiva/Sclera White and quiet White and quiet   Cornea 1+Punctate epithelial erosions, Arcus 1+Punctate epithelial erosions, Arcus   Anterior Chamber Deep and quiet Deep and quiet   Iris Round and dilated, No NVI Round and dilated, No NVI   Lens 2-3+ Nuclear sclerosis, 2+ Cortical cataract, +Vacuoles 2-3+ Nuclear sclerosis with brunescence, 2+ Cortical cataract, +Vacuoles   Vitreous Vitreous syneresis Vitreous syneresis       Fundus Exam      Right Left   Disc Normal, No NVD trace pallor, sharp rim   C/D Ratio 0.4 0.3   Macula good foveal relfex, Retinal pigment epithelial mottling, rare Microaneurysms, light focal laser scars temporal macula Flat, good foveal reflex, improved focal exudates and MA's -- improved with light focal laser scars inf/temp macula   Vessels Vascular attenuation, Tortuousity Mild Vascular attenuation, Tortuousity   Periphery Attached, Rare MA Attached, cluster blot hemes temporal periphery, scattered RPE changes        Refraction    Wearing Rx      Sphere Cylinder Axis Add   Right -0.75 +0.75 160 +2.50   Left -1.00 +0.75 025 +2.50   Type: PAL       Manifest Refraction      Sphere Cylinder Axis Dist VA   Right -0.75 +0.75 160 20/40   Left -1.00 +0.50 010 20/30  PH better OU          IMAGING AND PROCEDURES  Imaging and Procedures for  09/26/17  OCT, Retina - OU - Both Eyes       Right Eye Quality was good. Central Foveal Thickness: 223. Progression has been stable. Findings include normal foveal  contour, no SRF, no IRF (Trace non central cystic changes temporal macula - stable to slightly improved; partial PVD).   Left Eye Quality was good. Central Foveal Thickness: 218. Progression has improved. Findings include normal foveal contour, no SRF, intraretinal fluid, vitreomacular adhesion , intraretinal hyper-reflective material (interval improvement in IRF inf/temp macula).   Notes Images taken, stored on drive  Diagnosis / Impression:  DME OU, OS>OD OD- stably improved OS- interval decrease in IT IRF OS  Clinical management:  See below  Abbreviations: NFP - Normal foveal profile. CME - cystoid macular edema. PED - pigment epithelial detachment. IRF - intraretinal fluid. SRF - subretinal fluid. EZ - ellipsoid zone. ERM - epiretinal membrane. ORA - outer retinal atrophy. ORT - outer retinal tubulation. SRHM - subretinal hyper-reflective material         Fluorescein Angiography Optos (Transit OS)       Right Eye   Progression has no prior data. Early phase findings include microaneurysm, vascular perfusion defect. Mid/Late phase findings include microaneurysm, leakage, vascular perfusion defect (Late mild perivascular leakage temporal periphery in areas of non-perfusion (focal patch from 0830-0900)).   Left Eye   Progression has no prior data. Early phase findings include microaneurysm, vascular perfusion defect. Mid/Late phase findings include microaneurysm, leakage, vascular perfusion defect (Late mild perivascular leakage temporal periphery in areas of non-perfusion).   Notes **Images stored on drive**  Impression: Mild-Moderate NPDR OU Late leaking MA OU OD: Late mild perivascular leakage temporal periphery in areas of non-perfusion (focal patch from 0830-0900) OS: Late mild perivascular leakage  temporal periphery in areas of non-perfusion                  ASSESSMENT/PLAN:    ICD-10-CM   1. Moderate nonproliferative diabetic retinopathy of both eyes with macular edema associated with type 2 diabetes mellitus ([place])  D74.1287   2. Retinal edema  H35.81 OCT, Retina - OU - Both Eyes  3. Essential hypertension  I10   4. Hypertensive retinopathy of both eyes  H35.033 Fluorescein Angiography Optos (Transit OS)  5. Combined forms of age-related cataract of both eyes  H25.813   6. Nuclear sclerosis of both eyes  H25.13     1,2. Moderate non-proliferative diabetic retinopathy, both eyes -- stable - no NV noted on exam or prior FA - non central diabetic macular edema OU  - FA on 12.17.18 showed leaking microaneurysms amenable to focal laser   - S/P focal laser OS (03.20.19)  - S/P focal laser OD (04.04.19)  - DME significantly improved OU  - BCVA remains relatively good / stable (OD 20/30, OS 20/25-2)  - FA today (10.29.20) shows mild leakage from MA and peripheral vasculature OU  - no intervention indicated at this time  - f/u in 6 months for DFE/OCT  3,4. Hypertensive retinopathy OU  - discussed importance of tight BP control  - monitor  5. Nuclear sclerosis OU  - The symptoms of cataract, surgical options, and treatments and risks were discussed with patient.  - discussed diagnosis and progression  - under the management of expert surgeon, Dr. Shirleen Schirmer   - pt to reschedule appt with Dr. Katy Fitch   Ophthalmic Meds Ordered this visit:  No orders of the defined types were placed in this encounter.      Return in about 6 months (around 10/22/2019) for f/u NPDR OU, DFE, OCT.  There are no Patient Instructions on file for this visit.   Explained the diagnoses, plan, and follow up  with the patient and they expressed understanding.  Patient expressed understanding of the importance of proper follow up care.   Electronically signed by: Herby AbrahamAshley English, COA 10.26.2020  10:36AM   Karie ChimeraBrian G. Joakim Huesman, M.D., Ph.D. Diseases & Surgery of the Retina and Vitreous [facility]  I have reviewed the above documentation for accuracy and completeness, and I agree with the above. Karie ChimeraBrian G. Zaquan Duffner, M.D., Ph.D. 04/23/19 2:19 PM    Abbreviations: M myopia (nearsighted); A astigmatism; H hyperopia (farsighted); P presbyopia; Mrx spectacle prescription;  CTL contact lenses; OD right eye; OS left eye; OU both eyes  XT exotropia; ET esotropia; PEK punctate epithelial keratitis; PEE punctate epithelial erosions; DES dry eye syndrome; MGD meibomian gland dysfunction; ATs artificial tears; PFAT's preservative free artificial tears; [place] nuclear sclerotic cataract; PSC posterior subcapsular cataract; ERM epi-retinal membrane; PVD posterior vitreous detachment; RD retinal detachment; DM diabetes mellitus; DR diabetic retinopathy; NPDR non-proliferative diabetic retinopathy; PDR proliferative diabetic retinopathy; CSME clinically significant macular edema; DME diabetic macular edema; dbh dot blot hemorrhages; CWS cotton wool spot; POAG primary open angle glaucoma; C/D cup-to-disc ratio; HVF humphrey visual field; GVF goldmann visual field; OCT optical coherence tomography; IOP intraocular pressure; BRVO Branch retinal vein occlusion; CRVO central retinal vein occlusion; CRAO central retinal artery occlusion; BRAO branch retinal artery occlusion; RT retinal tear; SB scleral buckle; PPV pars plana vitrectomy; VH Vitreous hemorrhage; PRP panretinal laser photocoagulation; IVK intravitreal kenalog; VMT vitreomacular traction; MH Macular hole;  NVD neovascularization of the disc; NVE neovascularization elsewhere; AREDS age related eye disease study; ARMD age related macular degeneration; POAG primary open angle glaucoma; EBMD epithelial/anterior basement membrane dystrophy; ACIOL anterior chamber intraocular lens; IOL intraocular lens; PCIOL posterior chamber intraocular lens;  Phaco/IOL phacoemulsification with intraocular lens placement; PRK photorefractive keratectomy; LASIK laser assisted in situ keratomileusis; HTN hypertension; DM diabetes mellitus; COPD chronic obstructive pulmonary disease

## 2019-04-23 ENCOUNTER — Encounter (INDEPENDENT_AMBULATORY_CARE_PROVIDER_SITE_OTHER): Payer: Self-pay | Admitting: Ophthalmology

## 2019-04-23 ENCOUNTER — Ambulatory Visit (INDEPENDENT_AMBULATORY_CARE_PROVIDER_SITE_OTHER): Payer: HMO | Admitting: Ophthalmology

## 2019-04-23 ENCOUNTER — Other Ambulatory Visit: Payer: Self-pay

## 2019-04-23 DIAGNOSIS — H35033 Hypertensive retinopathy, bilateral: Secondary | ICD-10-CM

## 2019-04-23 DIAGNOSIS — H25813 Combined forms of age-related cataract, bilateral: Secondary | ICD-10-CM | POA: Diagnosis not present

## 2019-04-23 DIAGNOSIS — I10 Essential (primary) hypertension: Secondary | ICD-10-CM

## 2019-04-23 DIAGNOSIS — H2513 Age-related nuclear cataract, bilateral: Secondary | ICD-10-CM

## 2019-04-23 DIAGNOSIS — H3581 Retinal edema: Secondary | ICD-10-CM | POA: Diagnosis not present

## 2019-04-23 DIAGNOSIS — E113313 Type 2 diabetes mellitus with moderate nonproliferative diabetic retinopathy with macular edema, bilateral: Secondary | ICD-10-CM | POA: Diagnosis not present

## 2019-04-30 ENCOUNTER — Ambulatory Visit: Payer: Self-pay

## 2019-05-05 DIAGNOSIS — Z79899 Other long term (current) drug therapy: Secondary | ICD-10-CM | POA: Diagnosis not present

## 2019-05-05 DIAGNOSIS — E11319 Type 2 diabetes mellitus with unspecified diabetic retinopathy without macular edema: Secondary | ICD-10-CM | POA: Diagnosis not present

## 2019-05-05 DIAGNOSIS — I10 Essential (primary) hypertension: Secondary | ICD-10-CM | POA: Diagnosis not present

## 2019-05-05 DIAGNOSIS — H35033 Hypertensive retinopathy, bilateral: Secondary | ICD-10-CM | POA: Diagnosis not present

## 2019-05-05 DIAGNOSIS — Z794 Long term (current) use of insulin: Secondary | ICD-10-CM | POA: Diagnosis not present

## 2019-05-05 DIAGNOSIS — Z6841 Body Mass Index (BMI) 40.0 and over, adult: Secondary | ICD-10-CM | POA: Diagnosis not present

## 2019-05-05 DIAGNOSIS — E7800 Pure hypercholesterolemia, unspecified: Secondary | ICD-10-CM | POA: Diagnosis not present

## 2019-05-12 ENCOUNTER — Ambulatory Visit: Payer: Self-pay

## 2019-05-25 ENCOUNTER — Other Ambulatory Visit: Payer: Self-pay

## 2019-05-25 NOTE — Patient Outreach (Signed)
  [facility]) Care Management Chronic Special Needs Program  05/25/2019  Name: Kimberly Hammond DOB: 1950-11-15  MRN: [id number]  Ms. [name] is enrolled in a Chronic Special Needs Plan. RNCM called to follow up and review individualized care plan. Client was not home. HIPPA compliant message left.   Plan: Chronic care management coordinator will attempt outreach within 2-3 weeks.  Thea Silversmith, RN, MSN, [place] [phone]

## 2019-05-26 ENCOUNTER — Other Ambulatory Visit: Payer: Self-pay

## 2019-05-26 NOTE — Patient Outreach (Signed)
  [facility]) Care Management Chronic Special Needs Program    05/26/2019  Name: Kimberly Hammond, DOB: 09-Oct-1950  MRN: [id number]   Ms. Kimberly Hammond is enrolled in a chronic special needs plan. RNCM returned call to client. Person answering the phone states client was not there and that she will let client know that RNCM called again.  Plan: RNCM will follow up as previously scheduled.  Thea Silversmith, RN, MSN, [place] [phone]

## 2019-06-02 ENCOUNTER — Other Ambulatory Visit: Payer: Self-pay

## 2019-06-02 NOTE — Patient Outreach (Signed)
  [facility]) Care Management Chronic Special Needs Program  06/02/2019  Name: Kimberly Hammond DOB: 1950-07-13  MRN: [id number]  Ms. Kimberly Hammond is enrolled in a chronic special needs plan for Diabetes. Reviewed and updated care plan.  Subjective: client reports she is doing well. She reports last A1C was 7.4 on 05/05/2019. She states she follows up with her provider visits and denies any difficulty obtaining medications. Client is without questions and denies any issues or problems at this time.  Goals Addressed            This Visit's Progress   . COMPLETED: Client understands the importance of follow-up with providers by attending scheduled visits       Voiced importance of attending provider visits.    . Client will verbalize knowledge of diabetes self-management as evidenced by Hgb A1C <7 or as defined by provider.   On track    Diabetes self management actions:  Glucose monitoring per provider recommendations  Perform Quality checks on blood meter  Eat Healthy  Check feet daily  Visit provider every 3-6 months as directed  Hbg A1C level every 3-6 months.  Eye Exam yearly    . Client will verbalize knowledge of self management of Hypertension as evidences by BP reading of 140/90 or less; or as defined by provider   On track   . COMPLETED: Maintain timely refills of diabetic medication as prescribed within the year .       Denies any difficulty obtaining medications    . COMPLETED: Obtain annual  Lipid Profile, LDL-C       Done 05/05/2019    . COMPLETED: Obtain Annual Eye (retinal)  Exam        Done 04/23/2019    . Obtain Annual Foot Exam   On track   . COMPLETED: Obtain annual screen for micro albuminuria (urine) , nephropathy (kidney problems)       Done 05/05/2019    . COMPLETED: Obtain Hemoglobin A1C at least 2 times per year       Checked twice this year.    . Patient Stated   On track    Check blood sugar at least three times per week.      . COMPLETED: Visit Primary Care Provider or Endocrinologist at least 2 times per year        Reports has seen provider at least twice this year.      Covid 19 precautions discussed. RNCM encouraged client to 24 hour nurse advice line as needed. RNCM encouraged client to call health care concierge for benefits questions. Also encouraged client to call RNCM as needed.   Plan: RNCM will send updated care plan to client; send updated care plan to primary care. RNCM will outreach per tier level within the next 9-12 months.    Thea Silversmith, RN, MSN, [place] [phone]   .

## 2019-07-16 ENCOUNTER — Other Ambulatory Visit: Payer: Self-pay | Admitting: Family Medicine

## 2019-07-16 DIAGNOSIS — Z1231 Encounter for screening mammogram for malignant neoplasm of breast: Secondary | ICD-10-CM

## 2019-07-29 DIAGNOSIS — H2513 Age-related nuclear cataract, bilateral: Secondary | ICD-10-CM | POA: Diagnosis not present

## 2019-07-29 DIAGNOSIS — E113393 Type 2 diabetes mellitus with moderate nonproliferative diabetic retinopathy without macular edema, bilateral: Secondary | ICD-10-CM | POA: Diagnosis not present

## 2019-08-25 ENCOUNTER — Other Ambulatory Visit: Payer: Self-pay

## 2019-08-25 ENCOUNTER — Ambulatory Visit
Admission: RE | Admit: 2019-08-25 | Discharge: 2019-08-25 | Disposition: A | Discharge status: Home / Self Care | Payer: HMO | Source: Ambulatory Visit | Attending: Family Medicine | Admitting: Family Medicine

## 2019-08-25 DIAGNOSIS — Z1231 Encounter for screening mammogram for malignant neoplasm of breast: Secondary | ICD-10-CM

## 2019-10-19 NOTE — Progress Notes (Signed)
[facility] - Clinic Note  10/22/2019     CHIEF COMPLAINT Patient presents for Retina Follow Up   HISTORY OF PRESENT ILLNESS: Kimberly Hammond is a 69 y.o. female who presents to the clinic today for:   HPI    Retina Follow Up    Patient presents with  Diabetic Retinopathy.  In both eyes.  This started 6 months ago.  Severity is moderate.  I, the attending physician,  performed the HPI with the patient and updated documentation appropriately.          Comments    Patient here for 6 months retina follow up for NPDR OU. Patient states vision doing ok. No eye pain.        Last edited by Rennis Chris, MD on 10/22/2019  8:52 AM. (History)    pt states at her last appt with Dr. Dione Booze, he told her she was not ready cataract sx yet and to come back in a year, she is not using any gtts  Referring physician: Darrow Bussing, MD [address] [place],  [place] [address]  HISTORICAL INFORMATION:   Selected notes from the MEDICAL RECORD NUMBER Referred form Dr. Zetta Bills for concern of macular edema OS;  Ocular Hx- NPDR OU; cataract OU;  PMH- Type 2 DM; HTN   CURRENT MEDICATIONS: No current outpatient medications on file. (Ophthalmic Drugs)   No current facility-administered medications for this visit. (Ophthalmic Drugs)   Current Outpatient Medications (Other)  Medication Sig  . aspirin 81 MG chewable tablet Chew 81 mg by mouth daily.  Marland Kitchen atorvastatin (LIPITOR) 10 MG tablet Take 10 mg by mouth daily.  Marland Kitchen glipiZIDE (GLUCOTROL XL) 5 MG 24 hr tablet TAKE 1 TABLET BY MOUTH TWICE DAILY FOR 90 DAYS  . glipiZIDE (GLUCOTROL) 5 MG tablet Take 5 mg 2 (two) times daily before a meal by mouth.   . insulin glargine (LANTUS) 100 UNIT/ML injection Inject 24 Units into the skin at bedtime.  Marland Kitchen lisinopril-hydrochlorothiazide (PRINZIDE,ZESTORETIC) 20-25 MG tablet Take 1 tablet by mouth daily.  . metFORMIN (GLUCOPHAGE) 1000 MG tablet TAKE 1 TABLET BY MOUTH TWICE DAILY  WITH A MEAL  . metFORMIN (GLUCOPHAGE) 500 MG tablet Take 1,000 mg 2 (two) times daily with a meal by mouth.   . metoprolol succinate (TOPROL-XL) 25 MG 24 hr tablet Take 25 mg by mouth daily.  . mupirocin ointment (BACTROBAN) 2 % Apply 1 application topically 2 (two) times daily.  . naproxen (NAPROSYN) 500 MG tablet Take 500 mg 2 (two) times daily with a meal by mouth.   Letta Pate VERIO test strip as directed.  . polyethylene glycol-electrolytes (NULYTELY/GOLYTELY) 420 g solution See admin instructions.  . Potassium 99 MG TABS Take 1 tablet by mouth daily.  Marland Kitchen RELION INSULIN SYR 0.5ML/31G 31G X 5/16" 0.5 ML MISC as directed.   No current facility-administered medications for this visit. (Other)      REVIEW OF SYSTEMS: ROS    Positive for: Endocrine, Eyes   Negative for: Constitutional, Gastrointestinal, Neurological, Skin, Genitourinary, Musculoskeletal, HENT, Cardiovascular, Respiratory, Psychiatric, Allergic/Imm, Heme/Lymph   Last edited by Laddie Aquas, COA on 10/22/2019  8:43 AM. (History)       ALLERGIES No Known Allergies  PAST MEDICAL HISTORY Past Medical History:  Diagnosis Date  . Cataract    NS OU  . Diabetes mellitus without complication (HCC)   . Diabetic retinopathy (HCC)    NPDR OU  . Hypertension   .  Hypertensive retinopathy    OU   Past Surgical History:  Procedure Laterality Date  . EYE SURGERY  2015   OS/OD  . HYSTERECTOMY ABDOMINAL WITH SALPINGECTOMY      FAMILY HISTORY Family History  Problem Relation Age of Onset  . Hypertension Mother   . Diabetes Maternal Aunt   . Breast cancer Other     SOCIAL HISTORY Social History   Tobacco Use  . Smoking status: Never Smoker  . Smokeless tobacco: Never Used  Substance Use Topics  . Alcohol use: No  . Drug use: No         OPHTHALMIC EXAM:  Base Eye Exam    Visual Acuity (Snellen - Linear)      Right Left   Dist cc 20/50 20/25 -2   Dist ph cc 20/30 20/20 -2   Correction: Glasses        Tonometry (Tonopen, 8:40 AM)      Right Left   Pressure 15 13       Pupils      Dark Light Shape React APD   Right 3 2 Round Slow None   Left 3 2 Round Slow None       Visual Fields (Counting fingers)      Left Right    Full Full       Extraocular Movement      Right Left    Full, Ortho Full, Ortho       Neuro/Psych    Oriented x3: Yes   Mood/Affect: Normal       Dilation    Both eyes: 1.0% Mydriacyl, 2.5% Phenylephrine @ 8:40 AM        Slit Lamp and Fundus Exam    Slit Lamp Exam      Right Left   Lids/Lashes Dermatochalasis - upper lid, Meibomian gland dysfunction Dermatochalasis - upper lid, Meibomian gland dysfunction   Conjunctiva/Sclera White and quiet White and quiet   Cornea trace Punctate epithelial erosions, Arcus 1+inferior Punctate epithelial erosions, Arcus   Anterior Chamber Deep and quiet Deep and quiet   Iris Round and dilated, No NVI Round and dilated, No NVI   Lens 2-3+ Nuclear sclerosis with early brunescence, 2+ Cortical cataract, +Vacuoles, trace Posterior subcapsular cataract 2-3+ Nuclear sclerosis with brunescence, 2+ Cortical cataract, +Vacuoles   Vitreous Vitreous syneresis Vitreous syneresis       Fundus Exam      Right Left   Disc No NVD, Pink and Sharp, Compact trace pallor, sharp rim, Compact   C/D Ratio 0.3 0.3   Macula Flat, good foveal relfex, Retinal pigment epithelial mottling, Microaneurysms temporal macula, light focal laser scars temporal macula Flat, good foveal reflex, improved focal exudates and MA's, +light focal laser scars   Vessels Mild Vascular attenuation, Tortuousity Mild Vascular attenuation, Tortuousity   Periphery Attached, Rare MA Attached, cluster blot hemes temporal periphery, scattered RPE changes        Refraction    Wearing Rx      Sphere Cylinder Axis Add   Right -0.75 +0.75 160 +2.50   Left -1.00 +0.75 025 +2.50   Type: PAL          IMAGING AND PROCEDURES  Imaging and Procedures for  09/26/17  OCT, Retina - OU - Both Eyes       Right Eye Quality was good. Central Foveal Thickness: 223. Progression has improved. Findings include normal foveal contour, no SRF, no IRF, vitreomacular adhesion  (Trace non central cystic changes temporal  macula - stable to slightly improved; partial PVD).   Left Eye Quality was good. Central Foveal Thickness: 221. Progression has improved. Findings include normal foveal contour, no SRF, intraretinal fluid, vitreomacular adhesion , intraretinal hyper-reflective material (interval improvement in IRF inf/temp macula).   Notes Images taken, stored on drive  Diagnosis / Impression:  Mild DME OU, OS>OD OD- stably improved OS- interval decrease in IT IRF inf and temp OS  Clinical management:  See below  Abbreviations: NFP - Normal foveal profile. CME - cystoid macular edema. PED - pigment epithelial detachment. IRF - intraretinal fluid. SRF - subretinal fluid. EZ - ellipsoid zone. ERM - epiretinal membrane. ORA - outer retinal atrophy. ORT - outer retinal tubulation. SRHM - subretinal hyper-reflective material                  ASSESSMENT/PLAN:    ICD-10-CM   1. Moderate nonproliferative diabetic retinopathy of both eyes with macular edema associated with type 2 diabetes mellitus (HCC)  B35.3299   2. Retinal edema  H35.81 OCT, Retina - OU - Both Eyes  3. Essential hypertension  I10   4. Hypertensive retinopathy of both eyes  H35.033   5. Combined forms of age-related cataract of both eyes  H25.813     1,2. Moderate non-proliferative diabetic retinopathy, both eyes -- stable - no NV noted on exam or prior FA - non central diabetic macular edema OU  - FA on 12.17.18 showed leaking microaneurysms amenable to focal laser   - S/P focal laser OS (03.20.19)  - S/P focal laser OD (04.04.19)  - DME significantly improved OU  - BCVA remains relatively good / stable (OD 20/30, OS 20/20-2)  - FA (10.29.20) shows mild leakage from MA and  peripheral vasculature OU  - no intervention indicated at this time  - f/u in 6 months for DFE/OCT  3,4. Hypertensive retinopathy OU  - discussed importance of tight BP control  - monitor  5. Nuclear sclerosis OU  - The symptoms of cataract, surgical options, and treatments and risks were discussed with patient.  - discussed diagnosis and progression  - under the management of expert surgeon, Dr. Zetta Bills    Ophthalmic Meds Ordered this visit:  No orders of the defined types were placed in this encounter.      Return in about 6 months (around 04/22/2020) for f/u NPDR OU, DFE, OCT.  There are no Patient Instructions on file for this visit.   This document serves as a record of services personally performed by Karie Chimera, MD, PhD. It was created on their behalf by Herby Abraham, COA, a certified ophthalmic assistant. The creation of this record is the provider's dictation and/or activities during the visit.    Electronically signed by: Herby Abraham, COA @TODAY @ 1:52 AM   This document serves as a record of services personally performed by , MD, PhD. It was created on their behalf by Karie Chimera, OA, an ophthalmic assistant. The creation of this record is the provider's dictation and/or activities during the visit.    Electronically signed by: Laurian Brim, OA 04.29.2021 1:52 AM   05.01.2021, M.D., Ph.D. Diseases & Surgery of the Retina and Vitreous [facility]  I have reviewed the above documentation for accuracy and completeness, and I agree with the above. [facility], M.D., Ph.D. 10/23/19 1:52 AM   Abbreviations: M myopia (nearsighted); A astigmatism; H hyperopia (farsighted); P presbyopia; Mrx spectacle prescription;  CTL  contact lenses; OD right eye; OS left eye; OU both eyes  XT exotropia; ET esotropia; PEK punctate epithelial keratitis; PEE punctate epithelial erosions; DES dry eye syndrome; MGD meibomian gland  dysfunction; ATs artificial tears; PFAT's preservative free artificial tears; NSC nuclear sclerotic cataract; PSC posterior subcapsular cataract; ERM epi-retinal membrane; PVD posterior vitreous detachment; RD retinal detachment; DM diabetes mellitus; DR diabetic retinopathy; NPDR non-proliferative diabetic retinopathy; PDR proliferative diabetic retinopathy; CSME clinically significant macular edema; DME diabetic macular edema; dbh dot blot hemorrhages; CWS cotton wool spot; POAG primary open angle glaucoma; C/D cup-to-disc ratio; HVF humphrey visual field; GVF goldmann visual field; OCT optical coherence tomography; IOP intraocular pressure; BRVO Branch retinal vein occlusion; CRVO central retinal vein occlusion; CRAO central retinal artery occlusion; BRAO branch retinal artery occlusion; RT retinal tear; SB scleral buckle; PPV pars plana vitrectomy; VH Vitreous hemorrhage; PRP panretinal laser photocoagulation; IVK intravitreal kenalog; VMT vitreomacular traction; MH Macular hole;  NVD neovascularization of the disc; NVE neovascularization elsewhere; AREDS age related eye disease study; ARMD age related macular degeneration; POAG primary open angle glaucoma; EBMD epithelial/anterior basement membrane dystrophy; ACIOL anterior chamber intraocular lens; IOL intraocular lens; PCIOL posterior chamber intraocular lens; Phaco/IOL phacoemulsification with intraocular lens placement; PRK photorefractive keratectomy; LASIK laser assisted in situ keratomileusis; HTN hypertension; DM diabetes mellitus; COPD chronic obstructive pulmonary disease

## 2019-10-22 ENCOUNTER — Other Ambulatory Visit: Payer: Self-pay

## 2019-10-22 ENCOUNTER — Encounter (INDEPENDENT_AMBULATORY_CARE_PROVIDER_SITE_OTHER): Payer: Self-pay | Admitting: Ophthalmology

## 2019-10-22 ENCOUNTER — Ambulatory Visit (INDEPENDENT_AMBULATORY_CARE_PROVIDER_SITE_OTHER): Payer: HMO | Admitting: Ophthalmology

## 2019-10-22 DIAGNOSIS — I10 Essential (primary) hypertension: Secondary | ICD-10-CM

## 2019-10-22 DIAGNOSIS — H25813 Combined forms of age-related cataract, bilateral: Secondary | ICD-10-CM

## 2019-10-22 DIAGNOSIS — H35033 Hypertensive retinopathy, bilateral: Secondary | ICD-10-CM | POA: Diagnosis not present

## 2019-10-22 DIAGNOSIS — E113313 Type 2 diabetes mellitus with moderate nonproliferative diabetic retinopathy with macular edema, bilateral: Secondary | ICD-10-CM | POA: Diagnosis not present

## 2019-10-22 DIAGNOSIS — H3581 Retinal edema: Secondary | ICD-10-CM

## 2019-11-05 DIAGNOSIS — I10 Essential (primary) hypertension: Secondary | ICD-10-CM | POA: Diagnosis not present

## 2019-11-05 DIAGNOSIS — Z794 Long term (current) use of insulin: Secondary | ICD-10-CM | POA: Diagnosis not present

## 2019-11-05 DIAGNOSIS — R140 Abdominal distension (gaseous): Secondary | ICD-10-CM | POA: Diagnosis not present

## 2019-11-05 DIAGNOSIS — Z0001 Encounter for general adult medical examination with abnormal findings: Secondary | ICD-10-CM | POA: Diagnosis not present

## 2019-11-05 DIAGNOSIS — E2839 Other primary ovarian failure: Secondary | ICD-10-CM | POA: Diagnosis not present

## 2019-11-05 DIAGNOSIS — E7800 Pure hypercholesterolemia, unspecified: Secondary | ICD-10-CM | POA: Diagnosis not present

## 2019-11-05 DIAGNOSIS — Z79899 Other long term (current) drug therapy: Secondary | ICD-10-CM | POA: Diagnosis not present

## 2019-11-05 DIAGNOSIS — H35033 Hypertensive retinopathy, bilateral: Secondary | ICD-10-CM | POA: Diagnosis not present

## 2019-11-05 DIAGNOSIS — E11319 Type 2 diabetes mellitus with unspecified diabetic retinopathy without macular edema: Secondary | ICD-10-CM | POA: Diagnosis not present

## 2019-11-05 DIAGNOSIS — E113313 Type 2 diabetes mellitus with moderate nonproliferative diabetic retinopathy with macular edema, bilateral: Secondary | ICD-10-CM | POA: Diagnosis not present

## 2019-11-11 ENCOUNTER — Other Ambulatory Visit: Payer: Self-pay | Admitting: Family Medicine

## 2019-11-11 DIAGNOSIS — E2839 Other primary ovarian failure: Secondary | ICD-10-CM

## 2020-01-28 ENCOUNTER — Ambulatory Visit: Payer: Self-pay

## 2020-01-29 ENCOUNTER — Ambulatory Visit
Admission: RE | Admit: 2020-01-29 | Discharge: 2020-01-29 | Disposition: A | Discharge status: Home / Self Care | Payer: HMO | Source: Ambulatory Visit | Attending: Family Medicine | Admitting: Family Medicine

## 2020-01-29 ENCOUNTER — Other Ambulatory Visit: Payer: Self-pay

## 2020-01-29 DIAGNOSIS — Z780 Asymptomatic menopausal state: Secondary | ICD-10-CM | POA: Diagnosis not present

## 2020-01-29 DIAGNOSIS — M85851 Other specified disorders of bone density and structure, right thigh: Secondary | ICD-10-CM | POA: Diagnosis not present

## 2020-01-29 DIAGNOSIS — E2839 Other primary ovarian failure: Secondary | ICD-10-CM

## 2020-02-02 ENCOUNTER — Ambulatory Visit: Payer: HMO

## 2020-02-04 ENCOUNTER — Other Ambulatory Visit: Payer: Self-pay

## 2020-02-04 NOTE — Patient Outreach (Signed)
[facility]) Care Management Chronic Special Needs Program  02/04/2020  Name: Kimberly Hammond DOB: [birth year]  MRN: [id number]  Ms. Kimberly Hammond is enrolled in a chronic special needs plan for Diabetes. Chronic Care Management Coordinator called to follow up; discussed health risk assessment; updated individualized care plan. RNCM reviewed the chronic care management program, importance of client participation, and taking their care plan to all provider appointments and inpatient facilities.    Subjective: client reports she is attending provider visits and denies any medication management issues. Last A1C per chart 8.6 on 11/05/19. Client declines referral to health coach. Client denies any issues or concerns at this time.  Goals Addressed            This Visit's Progress   . Client will verbalize knowledge of diabetes self-management as evidenced by Hgb A1C <7 or as defined by provider.   On track    A1C 8.6 on 11/05/2019  Diabetes self management actions:  Glucose monitoring per provider recommendations, write down readings and take to next provider visit  Eat Healthy, plan to eat low carbohydrate, low salt meals, watch portion sizes and avoid sugar sweetened drinks. Discussed meal plan.  Visit provider every 3-6 months as directed  Hbg A1C level every 3-6 months.  Take medications as prescribed  Discussed availability of [employer] Health Coach. You may call the health care concierge or your RN care coordinator, if you would like to utilize this benefit.    . Client will verbalize knowledge of self management of Hypertension as evidences by BP reading of 140/90 or less; or as defined by provider   On track    Goal continued: Discussed blood pressure management.  Plan to check your blood pressure regularly. Write results down and take to your next office visit. Know what is the target blood pressure range. Ask your doctor. Continue to take  medications as prescribed. Call you doctor if you have any questions or concerns. Monitor your salt intake, Emmi education provided "dash diet". Please review and plan to discuss at next telephone assessment.     Marland Kitchen HEMOGLOBIN A1C < 7       Duplicate goal. See below    . COMPLETED: Obtain annual  Lipid Profile, LDL-C       Done 11/05/2019    . COMPLETED: Obtain Annual Eye (retinal)  Exam        Done 10/22/2019    . COMPLETED: Obtain Annual Foot Exam       Done 11/05/2019    . COMPLETED: Obtain annual screen for micro albuminuria (urine) , nephropathy (kidney problems)       Done 11/05/2019    . Patient Stated: check blood sugar at least twice a week   On track    Client reports she has been checking blood sugar about twice a week. Emmi education provided:" blood glucose monitoring. Please review and plan to discuss at next telephone assessment. Discussed importance of checking blood sugar, recording blood sugar and taking blood sugar readings to provider visits.       Client reports she is fully vaccinated for Covid 19. RNCM encouarged client to call RNCM as needed. Warm transfer to health care concierge completed regarding over the counter benefit.  Plan: send updated care plan to client, send updated care plan to primary care provider. Send education. Outreach to client per tier level within the next 9-12 months.   Kathyrn Sheriff, RN, MSN, [facility] Chronic Care Management Coordinator [facility] [phone]

## 2020-04-21 NOTE — Progress Notes (Signed)
[facility] Note  04/25/2020     CHIEF COMPLAINT Patient presents for Retina Follow Up   HISTORY OF PRESENT ILLNESS: Kimberly Hammond is a 69 y.o. female who presents to the clinic today for:   HPI    Retina Follow Up    Patient presents with  Diabetic Retinopathy.  In both eyes.  This started months ago.  Severity is moderate.  Duration of months.  Since onset it is stable.  I, the attending physician,  performed the HPI with the patient and updated documentation appropriately.          Comments    Pt states she feels like she needs to wear her glasses more often to see clearly, but states she does not wear them.  Pt did not bring glasses to visit today.  Pt denies eye pain or discomfort and denies any new or worsening floaters or fol OU.       Last edited by Bernarda Caffey, MD on 04/25/2020  9:00 AM. (History)    pt states she stopped wearing her glasses bc last time she renewed her license they told her she did not need to wear them to drive, pt saw Dr. Shirleen Schirmer at the beginning of the year and he told her she did not need cataract sx yet   Referring physician:  Lujean Amel, MD [address],  [place] [address]  HISTORICAL INFORMATION:   Selected notes from the [place] Referred form Dr. Shirleen Schirmer for concern of macular edema OS;  Ocular Hx- NPDR OU; cataract OU;  PMH- Type 2 DM; HTN   CURRENT MEDICATIONS: No current outpatient medications on file. (Ophthalmic Drugs)   No current facility-administered medications for this visit. (Ophthalmic Drugs)   Current Outpatient Medications (Other)  Medication Sig  . aspirin 81 MG chewable tablet Chew 81 mg by mouth daily.  Marland Kitchen atorvastatin (LIPITOR) 10 MG tablet Take 10 mg by mouth daily.  Marland Kitchen glipiZIDE (GLUCOTROL XL) 5 MG 24 hr tablet TAKE 1 TABLET BY MOUTH TWICE DAILY FOR 90 DAYS  . glipiZIDE (GLUCOTROL) 5 MG tablet Take 5 mg 2 (two) times daily before a meal by  mouth.   . insulin glargine (LANTUS) 100 UNIT/ML injection Inject 24 Units into the skin at bedtime.  Marland Kitchen lisinopril-hydrochlorothiazide (PRINZIDE,ZESTORETIC) 20-25 MG tablet Take 1 tablet by mouth daily.  . metFORMIN (GLUCOPHAGE) 1000 MG tablet TAKE 1 TABLET BY MOUTH TWICE DAILY WITH A MEAL  . metFORMIN (GLUCOPHAGE) 500 MG tablet Take 1,000 mg 2 (two) times daily with a meal by mouth.  (Patient not taking: Reported on 02/04/2020)  . metoprolol succinate (TOPROL-XL) 25 MG 24 hr tablet Take 25 mg by mouth daily. (Patient not taking: Reported on 02/04/2020)  . mupirocin ointment (BACTROBAN) 2 % Apply 1 application topically 2 (two) times daily.  . naproxen (NAPROSYN) 500 MG tablet Take 500 mg 2 (two) times daily with a meal by mouth.   . Nutritional Supplements (VITAMIN D MAINTENANCE PO) Take 1 capsule by mouth daily.  Glory Rosebush VERIO test strip as directed.  . polyethylene glycol-electrolytes (NULYTELY/GOLYTELY) 420 g solution See admin instructions. (Patient not taking: Reported on 02/04/2020)  . Potassium 99 MG TABS Take 1 tablet by mouth daily.  Marland Kitchen RELION INSULIN SYR 0.5ML/31G 31G X 5/16" 0.5 ML MISC as directed. (Patient not taking: Reported on 02/04/2020)   No current facility-administered medications for this visit. (Other)  REVIEW OF SYSTEMS: ROS    Positive for: Endocrine, Eyes   Negative for: Constitutional, Gastrointestinal, Neurological, Skin, Genitourinary, Musculoskeletal, HENT, Cardiovascular, Respiratory, Psychiatric, Allergic/Imm, Heme/Lymph   Last edited by Doneen Poisson on 04/25/2020  8:41 AM. (History)       ALLERGIES No Known Allergies  PAST MEDICAL HISTORY Past Medical History:  Diagnosis Date  . Cataract    NS OU  . Diabetes mellitus without complication ([place])   . Diabetic retinopathy ([place])    NPDR OU  . Hypertension   . Hypertensive retinopathy    OU   Past Surgical History:  Procedure Laterality Date  . EYE SURGERY  2015   OS/OD  . HYSTERECTOMY  ABDOMINAL WITH SALPINGECTOMY      FAMILY HISTORY Family History  Problem Relation Age of Onset  . Hypertension Mother   . Diabetes Maternal Aunt   . Breast cancer Other     SOCIAL HISTORY Social History   Tobacco Use  . Smoking status: Never Smoker  . Smokeless tobacco: Never Used  Vaping Use  . Vaping Use: Never used  Substance Use Topics  . Alcohol use: No  . Drug use: No         OPHTHALMIC EXAM:  Base Eye Exam    Visual Acuity (Snellen - Linear)      Right Left   Dist [place] 20/80 -2 20/40 -1   Dist ph [place] 20/30 -1 20/25 -2       Tonometry (Tonopen, 8:49 AM)      Right Left   Pressure 16 16       Pupils      Dark Light Shape React APD   Right 3 2 Round Brisk 0   Left 3 2 Round Brisk 0       Visual Fields      Left Right    Full Full       Extraocular Movement      Right Left    Full Full       Neuro/Psych    Oriented x3: Yes   Mood/Affect: Normal       Dilation    Both eyes: 1.0% Mydriacyl, 2.5% Phenylephrine @ 8:49 AM        Slit Lamp and Fundus Exam    Slit Lamp Exam      Right Left   Lids/Lashes Dermatochalasis - upper lid, Meibomian gland dysfunction Dermatochalasis - upper lid, Meibomian gland dysfunction   Conjunctiva/Sclera White and quiet White and quiet   Cornea trace Punctate epithelial erosions, Arcus Arcus, trace Debris in tear film   Anterior Chamber Deep and quiet Deep and quiet   Iris Round and dilated, No NVI Round and dilated, No NVI   Lens 2-3+ Nuclear sclerosis with early brunescence, 2+ Cortical cataract, +Vacuoles, trace Posterior subcapsular cataract 2-3+ Nuclear sclerosis with brunescence, 2+ Cortical cataract, +Vacuoles   Vitreous Vitreous syneresis Vitreous syneresis       Fundus Exam      Right Left   Disc No NVD, Pink and Sharp, Compact trace pallor, sharp rim, Compact   C/D Ratio 0.3 0.3   Macula Flat, blunted foveal relfex, Retinal pigment epithelial mottling, rare MA, mild focal laser scars temporal macula  Flat, blunted foveal reflex, RPE mottling, peristent focal exudates and MA's temporal mac, +light focal laser scars   Vessels attenuated, mild tortuousity attenuated, mild tortuousity   Periphery Attached, Rare MA Attached, scatterd MA, scattered RPE changes  IMAGING AND PROCEDURES  Imaging and Procedures for 09/26/17  OCT, Retina - OU - Both Eyes       Right Eye Quality was good. Central Foveal Thickness: 223. Progression has been stable. Findings include normal foveal contour, no SRF, no IRF, vitreomacular adhesion  (Trace non central cystic changes temporal macula and superior to disc - stable to slightly improved; partial PVD).   Left Eye Quality was good. Central Foveal Thickness: 217. Progression has been stable. Findings include normal foveal contour, no SRF, intraretinal fluid, vitreomacular adhesion , intraretinal hyper-reflective material (Persistent cystic changes/IRF/IRHM inf/temp macula).   Notes Images taken, stored on drive  Diagnosis / Impression:  Mild DME OU, OS>OD OD- stably improved OS- Persistent cystic changes/IRF/IRHM, inf/temp macula  Clinical management:  See below  Abbreviations: NFP - Normal foveal profile. CME - cystoid macular edema. PED - pigment epithelial detachment. IRF - intraretinal fluid. SRF - subretinal fluid. EZ - ellipsoid zone. ERM - epiretinal membrane. ORA - outer retinal atrophy. ORT - outer retinal tubulation. SRHM - subretinal hyper-reflective material                  ASSESSMENT/PLAN:    ICD-10-CM   1. Moderate nonproliferative diabetic retinopathy of both eyes with macular edema associated with type 2 diabetes mellitus ([place])  G89.1694   2. Retinal edema  H35.81 OCT, Retina - OU - Both Eyes  3. Essential hypertension  I10   4. Hypertensive retinopathy of both eyes  H35.033   5. Combined forms of age-related cataract of both eyes  H25.813   6. Nuclear sclerosis of both eyes  H25.13     1,2. Moderate  non-proliferative diabetic retinopathy, both eyes -- stable - no NV noted on exam or prior FA - non central diabetic macular edema OU  - FA on 12.17.18 showed leaking microaneurysms amenable to focal laser   - S/P focal laser OS (03.20.19)  - S/P focal laser OD (04.04.19)  - FA (10.29.20) shows mild leakage from MA and peripheral vasculature OU  - DME stably improved OU  - BCVA remains relatively good / stable (OD 20/30, OS 20/25-2)  - no intervention indicated at this time  - f/u in 6 months for DFE/OCT  3,4. Hypertensive retinopathy OU  - discussed importance of tight BP control  - monitor  5. Nuclear sclerosis OU  - The symptoms of cataract, surgical options, and treatments and risks were discussed with patient.  - discussed diagnosis and progression  - under the management of expert surgeon, Dr. Shirleen Schirmer    Ophthalmic Meds Ordered this visit:  No orders of the defined types were placed in this encounter.      Return in about 6 months (around 10/23/2020) for f/u NPDR OU, DFE, OCT.  There are no Patient Instructions on file for this visit.   This document serves as a record of services personally performed by Gardiner Sleeper, MD, PhD. It was created on their behalf by Leeann Must, [place], an ophthalmic technician. The creation of this record is the provider's dictation and/or activities during the visit.    Electronically signed by: Leeann Must, [place] 10.28.2021 9:36 AM   This document serves as a record of services personally performed by Gardiner Sleeper, MD, PhD. It was created on their behalf by San Jetty. Owens Shark, OA an ophthalmic technician. The creation of this record is the provider's dictation and/or activities during the visit.    Electronically signed by: San Jetty. Owens Shark, [place] 11.01.2021 9:36  AM  Gardiner Sleeper, M.D., Ph.D. Diseases & Surgery of the Retina and [place] 04/25/2020   I have reviewed the above documentation for  accuracy and completeness, and I agree with the above. Gardiner Sleeper, M.D., Ph.D. 04/25/20 9:36 AM  Abbreviations: M myopia (nearsighted); A astigmatism; H hyperopia (farsighted); P presbyopia; Mrx spectacle prescription;  CTL contact lenses; OD right eye; OS left eye; OU both eyes  XT exotropia; ET esotropia; PEK punctate epithelial keratitis; PEE punctate epithelial erosions; DES dry eye syndrome; MGD meibomian gland dysfunction; ATs artificial tears; PFAT's preservative free artificial tears; [place] nuclear sclerotic cataract; PSC posterior subcapsular cataract; ERM epi-retinal membrane; PVD posterior vitreous detachment; RD retinal detachment; DM diabetes mellitus; DR diabetic retinopathy; NPDR non-proliferative diabetic retinopathy; PDR proliferative diabetic retinopathy; CSME clinically significant macular edema; DME diabetic macular edema; dbh dot blot hemorrhages; CWS cotton wool spot; POAG primary open angle glaucoma; C/D cup-to-disc ratio; HVF humphrey visual field; GVF goldmann visual field; OCT optical coherence tomography; IOP intraocular pressure; BRVO Branch retinal vein occlusion; CRVO central retinal vein occlusion; CRAO central retinal artery occlusion; BRAO branch retinal artery occlusion; RT retinal tear; SB scleral buckle; PPV pars plana vitrectomy; VH Vitreous hemorrhage; PRP panretinal laser photocoagulation; IVK intravitreal kenalog; VMT vitreomacular traction; MH Macular hole;  NVD neovascularization of the disc; NVE neovascularization elsewhere; AREDS age related eye disease study; ARMD age related macular degeneration; POAG primary open angle glaucoma; EBMD epithelial/anterior basement membrane dystrophy; ACIOL anterior chamber intraocular lens; IOL intraocular lens; PCIOL posterior chamber intraocular lens; Phaco/IOL phacoemulsification with intraocular lens placement; [place] photorefractive keratectomy; LASIK laser assisted in situ keratomileusis; HTN hypertension; DM diabetes mellitus;  COPD chronic obstructive pulmonary disease

## 2020-04-25 ENCOUNTER — Other Ambulatory Visit: Payer: Self-pay

## 2020-04-25 ENCOUNTER — Encounter (INDEPENDENT_AMBULATORY_CARE_PROVIDER_SITE_OTHER): Payer: Self-pay | Admitting: Ophthalmology

## 2020-04-25 ENCOUNTER — Ambulatory Visit (INDEPENDENT_AMBULATORY_CARE_PROVIDER_SITE_OTHER): Payer: HMO | Admitting: Ophthalmology

## 2020-04-25 DIAGNOSIS — I10 Essential (primary) hypertension: Secondary | ICD-10-CM | POA: Diagnosis not present

## 2020-04-25 DIAGNOSIS — H2513 Age-related nuclear cataract, bilateral: Secondary | ICD-10-CM

## 2020-04-25 DIAGNOSIS — H3581 Retinal edema: Secondary | ICD-10-CM

## 2020-04-25 DIAGNOSIS — H25813 Combined forms of age-related cataract, bilateral: Secondary | ICD-10-CM | POA: Diagnosis not present

## 2020-04-25 DIAGNOSIS — H35033 Hypertensive retinopathy, bilateral: Secondary | ICD-10-CM

## 2020-04-25 DIAGNOSIS — E113313 Type 2 diabetes mellitus with moderate nonproliferative diabetic retinopathy with macular edema, bilateral: Secondary | ICD-10-CM

## 2020-04-28 ENCOUNTER — Other Ambulatory Visit: Payer: Self-pay

## 2020-04-28 NOTE — Patient Outreach (Signed)
  [facility]) Care Management Chronic Special Needs Program    04/28/2020  Name: Kimberly Hammond, DOB: 06-29-1950  MRN: [id number]   [facility] Care Management will continue to provide services for this member through 06/24/2020. The [facility] Care Management Team will assume care 06/25/2020.   Kathyrn Sheriff, RN, MSN, [facility] Chronic Care Management Coordinator [facility] [phone]

## 2020-05-09 DIAGNOSIS — E1165 Type 2 diabetes mellitus with hyperglycemia: Secondary | ICD-10-CM | POA: Diagnosis not present

## 2020-05-09 DIAGNOSIS — E7800 Pure hypercholesterolemia, unspecified: Secondary | ICD-10-CM | POA: Diagnosis not present

## 2020-05-09 DIAGNOSIS — I10 Essential (primary) hypertension: Secondary | ICD-10-CM | POA: Diagnosis not present

## 2020-05-09 DIAGNOSIS — Z79899 Other long term (current) drug therapy: Secondary | ICD-10-CM | POA: Diagnosis not present

## 2020-06-07 DIAGNOSIS — E7800 Pure hypercholesterolemia, unspecified: Secondary | ICD-10-CM | POA: Diagnosis not present

## 2020-06-07 DIAGNOSIS — E669 Obesity, unspecified: Secondary | ICD-10-CM | POA: Diagnosis not present

## 2020-06-07 DIAGNOSIS — E1165 Type 2 diabetes mellitus with hyperglycemia: Secondary | ICD-10-CM | POA: Diagnosis not present

## 2020-06-07 DIAGNOSIS — I10 Essential (primary) hypertension: Secondary | ICD-10-CM | POA: Diagnosis not present

## 2020-06-14 DIAGNOSIS — E1165 Type 2 diabetes mellitus with hyperglycemia: Secondary | ICD-10-CM | POA: Diagnosis not present

## 2020-06-14 DIAGNOSIS — E669 Obesity, unspecified: Secondary | ICD-10-CM | POA: Diagnosis not present

## 2020-06-14 DIAGNOSIS — E7800 Pure hypercholesterolemia, unspecified: Secondary | ICD-10-CM | POA: Diagnosis not present

## 2020-06-14 DIAGNOSIS — I10 Essential (primary) hypertension: Secondary | ICD-10-CM | POA: Diagnosis not present

## 2020-06-29 ENCOUNTER — Other Ambulatory Visit: Payer: Self-pay

## 2020-10-14 ENCOUNTER — Other Ambulatory Visit: Payer: Self-pay | Admitting: Family Medicine

## 2020-10-14 DIAGNOSIS — Z1231 Encounter for screening mammogram for malignant neoplasm of breast: Secondary | ICD-10-CM

## 2020-10-19 NOTE — Progress Notes (Signed)
[facility] Note  10/24/2020     CHIEF COMPLAINT Patient presents for Retina Follow Up   HISTORY OF PRESENT ILLNESS: Kimberly Hammond is a 70 y.o. female who presents to the clinic today for:   HPI    Retina Follow Up    Patient presents with  Diabetic Retinopathy.  In both eyes.  Duration of 6 months.  Since onset it is stable.  I, the attending physician,  performed the HPI with the patient and updated documentation appropriately.          Comments    6 month follow up NPDR OU- She took the vision test at the [facility] and was told she didn't need glasses to drive so she stopped wearing them.  Vision appears stable OU.  BS 90's about a month ago, she doesn't check often.  A1C unsure       Last edited by Bernarda Caffey, MD on 10/24/2020 10:49 PM. (History)    pt states when she drives her eyes get a little blurry, she goes back to see Dr. Katy Fitch in July, she has not had cataract sx yet  Referring physician:  Warden Fillers, MD [address] [place],  [place] [address]  HISTORICAL INFORMATION:   Selected notes from the MEDICAL RECORD NUMBER Referred form Dr. Shirleen Schirmer for concern of macular edema OS;  Ocular Hx- NPDR OU; cataract OU;  PMH- Type 2 DM; HTN   CURRENT MEDICATIONS: No current outpatient medications on file. (Ophthalmic Drugs)   No current facility-administered medications for this visit. (Ophthalmic Drugs)   Current Outpatient Medications (Other)  Medication Sig  . aspirin 81 MG chewable tablet Chew 81 mg by mouth daily.  Marland Kitchen atorvastatin (LIPITOR) 10 MG tablet Take 10 mg by mouth daily.  Marland Kitchen glipiZIDE (GLUCOTROL) 5 MG tablet Take 5 mg 2 (two) times daily before a meal by mouth.   . insulin glargine (LANTUS) 100 UNIT/ML injection Inject 24 Units into the skin at bedtime.  Marland Kitchen lisinopril-hydrochlorothiazide (PRINZIDE,ZESTORETIC) 20-25 MG tablet Take 1 tablet by mouth daily.  . metFORMIN (GLUCOPHAGE) 1000 MG tablet TAKE 1 TABLET BY  MOUTH TWICE DAILY WITH A MEAL  . mupirocin ointment (BACTROBAN) 2 % Apply 1 application topically 2 (two) times daily.  . naproxen (NAPROSYN) 500 MG tablet Take 500 mg by mouth 2 (two) times daily with a meal.  . Nutritional Supplements (VITAMIN D MAINTENANCE PO) Take 1 capsule by mouth daily.  . Potassium 99 MG TABS Take 1 tablet by mouth daily.  Marland Kitchen glipiZIDE (GLUCOTROL XL) 5 MG 24 hr tablet TAKE 1 TABLET BY MOUTH TWICE DAILY FOR 90 DAYS  . metFORMIN (GLUCOPHAGE) 500 MG tablet Take 1,000 mg 2 (two) times daily with a meal by mouth.  (Patient not taking: No sig reported)  . metoprolol succinate (TOPROL-XL) 25 MG 24 hr tablet Take 25 mg by mouth daily. (Patient not taking: No sig reported)  . ONETOUCH VERIO test strip as directed.  . polyethylene glycol-electrolytes (NULYTELY/GOLYTELY) 420 g solution See admin instructions. (Patient not taking: Reported on 10/24/2020)  . RELION INSULIN SYR 0.5ML/31G 31G X 5/16" 0.5 ML MISC as directed. (Patient not taking: No sig reported)   No current facility-administered medications for this visit. (Other)      REVIEW OF SYSTEMS: ROS    Positive for: Endocrine, Eyes   Negative for: Constitutional, Gastrointestinal, Neurological, Skin, Genitourinary, Musculoskeletal, HENT, Cardiovascular, Respiratory, Psychiatric, Allergic/Imm, Heme/Lymph   Last edited by Leonie Douglas,  COA on 10/24/2020  8:39 AM. (History)       ALLERGIES No Known Allergies  PAST MEDICAL HISTORY Past Medical History:  Diagnosis Date  . Cataract    NS OU  . Diabetes mellitus without complication ([place])   . Diabetic retinopathy ([place])    NPDR OU  . Hypertension   . Hypertensive retinopathy    OU   Past Surgical History:  Procedure Laterality Date  . EYE SURGERY  2015   OS/OD  . HYSTERECTOMY ABDOMINAL WITH SALPINGECTOMY      FAMILY HISTORY Family History  Problem Relation Age of Onset  . Hypertension Mother   . Diabetes Maternal Aunt   . Breast cancer Other     SOCIAL  HISTORY Social History   Tobacco Use  . Smoking status: Never Smoker  . Smokeless tobacco: Never Used  Vaping Use  . Vaping Use: Never used  Substance Use Topics  . Alcohol use: No  . Drug use: No         OPHTHALMIC EXAM:  Base Eye Exam    Visual Acuity (Snellen - Linear)      Right Left   Dist [place] 20/70 20/30+   Dist ph [place] 20/40 20/25-       Tonometry (Tonopen, 8:49 AM)      Right Left   Pressure 22 22       Pupils      Dark Light Shape React APD   Right 3 2 Round Brisk None   Left 3 2 Round Brisk None       Visual Fields (Counting fingers)      Left Right    Full Full       Extraocular Movement      Right Left    Full Full       Neuro/Psych    Oriented x3: Yes   Mood/Affect: Normal       Dilation    Both eyes: 1.0% Mydriacyl, 2.5% Phenylephrine @ 8:49 AM        Slit Lamp and Fundus Exam    Slit Lamp Exam      Right Left   Lids/Lashes Dermatochalasis - upper lid, Meibomian gland dysfunction Dermatochalasis - upper lid, Meibomian gland dysfunction   Conjunctiva/Sclera White and quiet White and quiet   Cornea trace Punctate epithelial erosions, Arcus Arcus, trace PEE   Anterior Chamber Deep and quiet Deep and quiet   Iris Round and dilated, No NVI Round and dilated, No NVI   Lens 2-3+ Nuclear sclerosis with early brunescence, 2-3+ Cortical cataract, +Vacuoles, trace Posterior subcapsular cataract 2-3+ Nuclear sclerosis with brunescence, 2-3+ Cortical cataract, +Vacuoles   Vitreous Vitreous syneresis Vitreous syneresis       Fundus Exam      Right Left   Disc No NVD, Pink and Sharp, Compact, PPP/PPA trace pallor, sharp rim, Compact   C/D Ratio 0.3 0.3   Macula Flat, blunted foveal relfex, Retinal pigment epithelial mottling, scattered MA greatest temporal macula, mild focal laser scars temporal macula Flat, blunted foveal reflex, RPE mottling, peristent focal exudates and MA's temporal mac -- slighlty improved, +light focal laser scars   Vessels  attenuated, mild tortuousity attenuated, mild tortuousity   Periphery Attached, Rare MA / DBH Attached, scatterd MA, scattered RPE changes          IMAGING AND PROCEDURES  Imaging and Procedures for 09/26/17  OCT, Retina - OU - Both Eyes       Right Eye Quality was good.  Central Foveal Thickness: 219. Progression has been stable. Findings include normal foveal contour, no SRF, no IRF, vitreomacular adhesion  (Trace non central cystic changes temporal macula and superior to disc - stable to slightly improved; partial PVD).   Left Eye Quality was good. Central Foveal Thickness: 217. Progression has been stable. Findings include normal foveal contour, no SRF, intraretinal fluid, vitreomacular adhesion , intraretinal hyper-reflective material (Mild interval improvement in IRF inf/temp macula).   Notes Images taken, stored on drive  Diagnosis / Impression:  Mild DME OU, OS>OD OD- stably improved OS- Mild interval improvement in IRF inf/temp macula  Clinical management:  See below  Abbreviations: NFP - Normal foveal profile. CME - cystoid macular edema. PED - pigment epithelial detachment. IRF - intraretinal fluid. SRF - subretinal fluid. EZ - ellipsoid zone. ERM - epiretinal membrane. ORA - outer retinal atrophy. ORT - outer retinal tubulation. SRHM - subretinal hyper-reflective material                  ASSESSMENT/PLAN:    ICD-10-CM   1. Moderate nonproliferative diabetic retinopathy of both eyes with macular edema associated with type 2 diabetes mellitus ([place])  O29.4765   2. Retinal edema  H35.81 OCT, Retina - OU - Both Eyes  3. Essential hypertension  I10   4. Hypertensive retinopathy of both eyes  H35.033   5. Combined forms of age-related cataract of both eyes  H25.813     1,2. Moderate non-proliferative diabetic retinopathy, both eyes -- stable  - no NV noted on exam or prior FA  - non central diabetic macular edema OU  - FA on 12.17.18 showed leaking  microaneurysms amenable to focal laser   - S/P focal laser OS (03.20.19)  - S/P focal laser OD (04.04.19)  - FA (10.29.20) shows mild leakage from MA and peripheral vasculature OU  - DME stably improved OU  - BCVA remains relatively good / stable (OD 20/40, OS 20/25-2)  - no intervention indicated at this time  - f/u in 6 months for DFE/OCT/FA (transit OD)  3,4. Hypertensive retinopathy OU  - discussed importance of tight BP control  - monitor  5. Nuclear sclerosis OU  - The symptoms of cataract, surgical options, and treatments and risks were discussed with patient.  - discussed diagnosis and progression  - under the management of expert surgeon, Dr. Shirleen Schirmer   - clear from a retina standpoint to proceed with cataract surgery when pt and surgeon are ready    Ophthalmic Meds Ordered this visit:  No orders of the defined types were placed in this encounter.      Return in about 6 months (around 04/26/2021) for f/u NPDR OU, DFE, OCT.  There are no Patient Instructions on file for this visit.   This document serves as a record of services personally performed by Gardiner Sleeper, MD, PhD. It was created on their behalf by Leeann Must, [place], an ophthalmic technician. The creation of this record is the provider's dictation and/or activities during the visit.    Electronically signed by: Leeann Must, COA _0 @ 10:52 PM   This document serves as a record of services personally performed by Gardiner Sleeper, MD, PhD. It was created on their behalf by San Jetty. Owens Shark, OA an ophthalmic technician. The creation of this record is the provider's dictation and/or activities during the visit.    Electronically signed by: San Jetty. Owens Shark, [place] 05.02.2022 10:52 PM  Gardiner Sleeper, M.D., Ph.D. Diseases & Surgery of  the Retina and Vitreous [facility] 10/24/2020   I have reviewed the above documentation for accuracy and completeness, and I agree with the above. Gardiner Sleeper, M.D., Ph.D. 10/24/20 10:52 PM   Abbreviations: M myopia (nearsighted); A astigmatism; H hyperopia (farsighted); P presbyopia; Mrx spectacle prescription;  CTL contact lenses; OD right eye; OS left eye; OU both eyes  XT exotropia; ET esotropia; PEK punctate epithelial keratitis; PEE punctate epithelial erosions; DES dry eye syndrome; MGD meibomian gland dysfunction; ATs artificial tears; PFAT's preservative free artificial tears; [place] nuclear sclerotic cataract; PSC posterior subcapsular cataract; ERM epi-retinal membrane; PVD posterior vitreous detachment; RD retinal detachment; DM diabetes mellitus; DR diabetic retinopathy; NPDR non-proliferative diabetic retinopathy; PDR proliferative diabetic retinopathy; CSME clinically significant macular edema; DME diabetic macular edema; dbh dot blot hemorrhages; CWS cotton wool spot; POAG primary open angle glaucoma; C/D cup-to-disc ratio; HVF humphrey visual field; GVF goldmann visual field; OCT optical coherence tomography; IOP intraocular pressure; BRVO Branch retinal vein occlusion; CRVO central retinal vein occlusion; CRAO central retinal artery occlusion; BRAO branch retinal artery occlusion; RT retinal tear; SB scleral buckle; PPV pars plana vitrectomy; VH Vitreous hemorrhage; PRP panretinal laser photocoagulation; IVK intravitreal kenalog; VMT vitreomacular traction; MH Macular hole;  NVD neovascularization of the disc; NVE neovascularization elsewhere; AREDS age related eye disease study; ARMD age related macular degeneration; POAG primary open angle glaucoma; EBMD epithelial/anterior basement membrane dystrophy; ACIOL anterior chamber intraocular lens; IOL intraocular lens; PCIOL posterior chamber intraocular lens; Phaco/IOL phacoemulsification with intraocular lens placement; [place] photorefractive keratectomy; LASIK laser assisted in situ keratomileusis; HTN hypertension; DM diabetes mellitus; COPD chronic obstructive pulmonary disease

## 2020-10-24 ENCOUNTER — Other Ambulatory Visit: Payer: Self-pay

## 2020-10-24 ENCOUNTER — Encounter (INDEPENDENT_AMBULATORY_CARE_PROVIDER_SITE_OTHER): Payer: Self-pay | Admitting: Ophthalmology

## 2020-10-24 ENCOUNTER — Ambulatory Visit (INDEPENDENT_AMBULATORY_CARE_PROVIDER_SITE_OTHER): Payer: HMO | Admitting: Ophthalmology

## 2020-10-24 DIAGNOSIS — E113313 Type 2 diabetes mellitus with moderate nonproliferative diabetic retinopathy with macular edema, bilateral: Secondary | ICD-10-CM

## 2020-10-24 DIAGNOSIS — H35033 Hypertensive retinopathy, bilateral: Secondary | ICD-10-CM | POA: Diagnosis not present

## 2020-10-24 DIAGNOSIS — I10 Essential (primary) hypertension: Secondary | ICD-10-CM | POA: Diagnosis not present

## 2020-10-24 DIAGNOSIS — H3581 Retinal edema: Secondary | ICD-10-CM | POA: Diagnosis not present

## 2020-10-24 DIAGNOSIS — H25813 Combined forms of age-related cataract, bilateral: Secondary | ICD-10-CM | POA: Diagnosis not present

## 2020-10-24 DIAGNOSIS — H2513 Age-related nuclear cataract, bilateral: Secondary | ICD-10-CM

## 2020-11-09 DIAGNOSIS — I10 Essential (primary) hypertension: Secondary | ICD-10-CM | POA: Diagnosis not present

## 2020-11-09 DIAGNOSIS — R634 Abnormal weight loss: Secondary | ICD-10-CM | POA: Diagnosis not present

## 2020-11-09 DIAGNOSIS — D649 Anemia, unspecified: Secondary | ICD-10-CM | POA: Diagnosis not present

## 2020-11-09 DIAGNOSIS — K5900 Constipation, unspecified: Secondary | ICD-10-CM | POA: Diagnosis not present

## 2020-11-09 DIAGNOSIS — E11319 Type 2 diabetes mellitus with unspecified diabetic retinopathy without macular edema: Secondary | ICD-10-CM | POA: Diagnosis not present

## 2020-11-09 DIAGNOSIS — Z79899 Other long term (current) drug therapy: Secondary | ICD-10-CM | POA: Diagnosis not present

## 2020-11-09 DIAGNOSIS — E113313 Type 2 diabetes mellitus with moderate nonproliferative diabetic retinopathy with macular edema, bilateral: Secondary | ICD-10-CM | POA: Diagnosis not present

## 2020-11-15 ENCOUNTER — Ambulatory Visit (HOSPITAL_COMMUNITY)
Admission: EM | Admit: 2020-11-15 | Discharge: 2020-11-15 | Disposition: A | Discharge status: Home / Self Care | Payer: HMO | Attending: Family Medicine | Admitting: Family Medicine

## 2020-11-15 ENCOUNTER — Other Ambulatory Visit: Payer: Self-pay

## 2020-11-15 ENCOUNTER — Encounter (HOSPITAL_COMMUNITY): Payer: Self-pay | Admitting: *Deleted

## 2020-11-15 DIAGNOSIS — J069 Acute upper respiratory infection, unspecified: Secondary | ICD-10-CM | POA: Diagnosis not present

## 2020-11-15 DIAGNOSIS — R059 Cough, unspecified: Secondary | ICD-10-CM | POA: Insufficient documentation

## 2020-11-15 DIAGNOSIS — Z20822 Contact with and (suspected) exposure to covid-19: Secondary | ICD-10-CM | POA: Insufficient documentation

## 2020-11-15 LAB — SARS CORONAVIRUS 2 (TAT 6-24 HRS): SARS Coronavirus 2: NEGATIVE

## 2020-11-15 MED ORDER — BENZONATATE 100 MG PO CAPS
100.0000 mg | ORAL_CAPSULE | Freq: Three times a day (TID) | ORAL | 0 refills | Status: AC | PRN
Start: 2020-11-15 — End: ?

## 2020-11-15 NOTE — ED Triage Notes (Signed)
Pt reports Sx started on Friday . Scratchy throat ,cough and runny nose.

## 2020-11-15 NOTE — ED Provider Notes (Signed)
[facility]    CSN: [id number] Arrival date & time: 11/15/20  0805      History   Chief Complaint Chief Complaint  Patient presents with  . scratchy throat  . Nasal Congestion    HPI Kimberly Hammond is a 70 y.o. female.   HPI Patient presents with URI symptoms including cough, sore throat, , nasal congestion.  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  She has been without fever.  She denies any chest pain or shortness of breath.  Cough is most prominent at nighttime.  She has taken Zyrtec in the past however has not resumed it this spring season.  She has tried Mucinex which gave her mild relief last night.  Past Medical History:  Diagnosis Date  . Cataract    NS OU  . Diabetes mellitus without complication (HCC)   . Diabetic retinopathy (HCC)    NPDR OU  . Hypertension   . Hypertensive retinopathy    OU    There are no problems to display for this patient.   Past Surgical History:  Procedure Laterality Date  . EYE SURGERY  2015   OS/OD  . HYSTERECTOMY ABDOMINAL WITH SALPINGECTOMY      OB History   No obstetric history on file.      Home Medications    Prior to Admission medications   Medication Sig Start Date End Date Taking? Authorizing Provider  aspirin 81 MG chewable tablet Chew 81 mg by mouth daily.    [provider]  atorvastatin (LIPITOR) 10 MG tablet Take 10 mg by mouth daily. 01/02/18   [provider]  glipiZIDE (GLUCOTROL XL) 5 MG 24 hr tablet TAKE 1 TABLET BY MOUTH TWICE DAILY FOR 90 DAYS 10/17/18   [provider]  glipiZIDE (GLUCOTROL) 5 MG tablet Take 5 mg 2 (two) times daily before a meal by mouth.     [provider]  insulin glargine (LANTUS) 100 UNIT/ML injection Inject 24 Units into the skin at bedtime.    [provider]  lisinopril-hydrochlorothiazide (PRINZIDE,ZESTORETIC) 20-25 MG tablet Take 1 tablet by mouth daily.    [provider]  metFORMIN (GLUCOPHAGE) 1000 MG tablet TAKE 1 TABLET BY MOUTH TWICE DAILY WITH A MEAL 12/30/18   [provider]  metFORMIN (GLUCOPHAGE) 500 MG tablet Take 1,000 mg 2 (two) times daily with a meal by mouth.  Patient not taking: No sig reported    [provider]  metoprolol succinate (TOPROL-XL) 25 MG 24 hr tablet Take 25 mg by mouth daily. Patient not taking: No sig reported    [provider]  mupirocin ointment (BACTROBAN) 2 % Apply 1 application topically 2 (two) times daily. 04/21/17   Cathie Hoops, Amy V, PA-C  naproxen (NAPROSYN) 500 MG tablet Take 500 mg by mouth 2 (two) times daily with a meal. 04/05/17   [provider]  Nutritional Supplements (VITAMIN D MAINTENANCE PO) Take 1 capsule by mouth daily.    [provider]  [facility] VERIO test strip as directed. 02/05/18   [provider]  polyethylene glycol-electrolytes (NULYTELY/GOLYTELY) 420 g solution See admin instructions. Patient not taking: Reported on 10/24/2020 01/12/19   [provider]  Potassium 99 MG TABS Take 1 tablet by mouth daily.    [provider]  RELION INSULIN SYR 0.5ML/31G 31G X 5/16" 0.5 ML MISC as directed. Patient not taking: No sig reported 12/25/17   [provider]    Family History Family History  Problem Relation Age of Onset  . Hypertension Mother   . Diabetes Maternal Aunt   . Breast cancer Other     Social History Social History   Tobacco Use  . Smoking status: Never Smoker  . Smokeless tobacco: Never Used  Vaping Use  . Vaping Use: Never used  Substance Use Topics  . Alcohol use: No  . Drug use: No     Allergies   Patient has no known allergies.   Review of Systems Review of Systems Pertinent negatives listed in HPI   Physical Exam Triage Vital Signs ED Triage Vitals  Enc Vitals Group     BP 11/15/20 0826 117/75     Pulse Rate 11/15/20 0826 75     Resp 11/15/20 0826 16     Temp 11/15/20 0826 98.3 F  (36.8 C)     Temp Source 11/15/20 0826 Oral     SpO2 11/15/20 0826 98 %     Weight --      Height --      Head Circumference --      Peak Flow --      Pain Score 11/15/20 0824 0     Pain Loc --      Pain Edu? --      Excl. in GC? --    No data found.  Updated Vital Signs BP 117/75   Pulse 75   Temp 98.3 F (36.8 C) (Oral)   Resp 16   SpO2 98%   Visual Acuity Right Eye Distance:   Left Eye Distance:   Bilateral Distance:    Right Eye Near:   Left Eye Near:    Bilateral Near:     Physical Exam  General Appearance:    Alert, cooperative, no distress  HENT:   Normocephalic, ears normal, nares mucosal edema with congestion, oropharynx no edema and non erythematous    Eyes:    PERRL, conjunctiva/corneas clear, EOM's intact       Lungs:     Clear to auscultation bilaterally, respirations unlabored  Heart:    Regular rate and rhythm  Neurologic:   Awake, alert, oriented x 3. No apparent focal neurological           defect.     UC Treatments / Results  Labs (all labs ordered are listed, but only abnormal results are displayed) Labs Reviewed - No data to display  EKG   Radiology No results found.  Procedures Procedures (including critical care time)  Medications Ordered in UC Medications - No data to display  Initial Impression / Assessment and Plan / UC Course  I have reviewed the triage vital signs and the nursing notes.  Pertinent labs & imaging results that were available during my care of the patient were reviewed by me and considered in my medical decision making (see chart for details).     COVID test pending. Symptom management warranted only.  Manage fever with Tylenol and ibuprofen.  Nasal symptoms with over-the-counter antihistamines recommended.  Treatment per discharge medications/discharge instructions.  Red flags/ER precautions given. The most current CDC isolation/quarantine recommendation advised.  Final Clinical Impressions(s) / UC Diagnoses    Final diagnoses:  Viral URI with cough     Discharge Instructions     Resume Zyrtec take 1 tablet nightly at bedtime for nasal symptoms.  I prescribed you benzonatate Perles for cough you may take 1 tablet as needed up to 3 times  daily for cough.  Hydrate well with fluids.  If Mucinex is helping relieve your symptoms you may continue that as well.  Your COVID test will result within the next 2 days.  Our office will only contact you if your COVID test is positive.  If it is negative no one will contact you.    ED Prescriptions    Medication Sig Dispense Auth. Provider   benzonatate (TESSALON) 100 MG capsule Take 1 capsule (100 mg total) by mouth 3 (three) times daily as needed for cough. 40 capsule Bing Neighbors, FNP     PDMP not reviewed this encounter.   Bing Neighbors, [place] 11/15/20 [phone]

## 2020-11-15 NOTE — Discharge Instructions (Signed)
Resume Zyrtec take 1 tablet nightly at bedtime for nasal symptoms.  I prescribed you benzonatate Perles for cough you may take 1 tablet as needed up to 3 times daily for cough.  Hydrate well with fluids.  If Mucinex is helping relieve your symptoms you may continue that as well.  Your COVID test will result within the next 2 days.  Our office will only contact you if your COVID test is positive.  If it is negative no one will contact you.

## 2020-12-05 ENCOUNTER — Other Ambulatory Visit: Payer: Self-pay

## 2020-12-05 ENCOUNTER — Ambulatory Visit
Admission: RE | Admit: 2020-12-05 | Discharge: 2020-12-05 | Disposition: A | Discharge status: Home / Self Care | Payer: HMO | Source: Ambulatory Visit | Attending: Family Medicine | Admitting: Family Medicine

## 2020-12-05 DIAGNOSIS — Z1231 Encounter for screening mammogram for malignant neoplasm of breast: Secondary | ICD-10-CM | POA: Diagnosis not present

## 2020-12-14 DIAGNOSIS — I10 Essential (primary) hypertension: Secondary | ICD-10-CM | POA: Diagnosis not present

## 2020-12-14 DIAGNOSIS — E7800 Pure hypercholesterolemia, unspecified: Secondary | ICD-10-CM | POA: Diagnosis not present

## 2020-12-14 DIAGNOSIS — E1165 Type 2 diabetes mellitus with hyperglycemia: Secondary | ICD-10-CM | POA: Diagnosis not present

## 2020-12-14 DIAGNOSIS — E669 Obesity, unspecified: Secondary | ICD-10-CM | POA: Diagnosis not present

## 2020-12-16 DIAGNOSIS — D649 Anemia, unspecified: Secondary | ICD-10-CM | POA: Diagnosis not present

## 2020-12-27 DIAGNOSIS — H2513 Age-related nuclear cataract, bilateral: Secondary | ICD-10-CM | POA: Diagnosis not present

## 2020-12-27 DIAGNOSIS — E113393 Type 2 diabetes mellitus with moderate nonproliferative diabetic retinopathy without macular edema, bilateral: Secondary | ICD-10-CM | POA: Diagnosis not present

## 2021-01-20 DIAGNOSIS — E611 Iron deficiency: Secondary | ICD-10-CM | POA: Diagnosis not present

## 2021-01-20 DIAGNOSIS — Z01419 Encounter for gynecological examination (general) (routine) without abnormal findings: Secondary | ICD-10-CM | POA: Diagnosis not present

## 2021-04-07 DIAGNOSIS — E611 Iron deficiency: Secondary | ICD-10-CM | POA: Diagnosis not present

## 2021-04-21 NOTE — Progress Notes (Shared)
[facility] - Clinic Note  04/26/2021     CHIEF COMPLAINT Patient presents for No chief complaint on file.   HISTORY OF PRESENT ILLNESS: Kimberly Hammond is a 70 y.o. female who presents to the clinic today for:     Referring physician:  Darrow Bussing, MD [address] [place],  [place] [address]  HISTORICAL INFORMATION:   Selected notes from the MEDICAL RECORD NUMBER Referred form Dr. Zetta Bills for concern of macular edema OS;  Ocular Hx- NPDR OU; cataract OU;  PMH- Type 2 DM; HTN   CURRENT MEDICATIONS: No current outpatient medications on file. (Ophthalmic Drugs)   No current facility-administered medications for this visit. (Ophthalmic Drugs)   Current Outpatient Medications (Other)  Medication Sig   aspirin 81 MG chewable tablet Chew 81 mg by mouth daily.   atorvastatin (LIPITOR) 10 MG tablet Take 10 mg by mouth daily.   benzonatate (TESSALON) 100 MG capsule Take 1 capsule (100 mg total) by mouth 3 (three) times daily as needed for cough.   glipiZIDE (GLUCOTROL XL) 5 MG 24 hr tablet TAKE 1 TABLET BY MOUTH TWICE DAILY FOR 90 DAYS   glipiZIDE (GLUCOTROL) 5 MG tablet Take 5 mg 2 (two) times daily before a meal by mouth.    insulin glargine (LANTUS) 100 UNIT/ML injection Inject 24 Units into the skin at bedtime.   lisinopril-hydrochlorothiazide (PRINZIDE,ZESTORETIC) 20-25 MG tablet Take 1 tablet by mouth daily.   metFORMIN (GLUCOPHAGE) 1000 MG tablet TAKE 1 TABLET BY MOUTH TWICE DAILY WITH A MEAL   metFORMIN (GLUCOPHAGE) 500 MG tablet Take 1,000 mg 2 (two) times daily with a meal by mouth.  (Patient not taking: No sig reported)   metoprolol succinate (TOPROL-XL) 25 MG 24 hr tablet Take 25 mg by mouth daily. (Patient not taking: No sig reported)   mupirocin ointment (BACTROBAN) 2 % Apply 1 application topically 2 (two) times daily.   naproxen (NAPROSYN) 500 MG tablet Take 500 mg by mouth 2 (two) times daily with a meal.   Nutritional  Supplements (VITAMIN D MAINTENANCE PO) Take 1 capsule by mouth daily.   ONETOUCH VERIO test strip as directed.   polyethylene glycol-electrolytes (NULYTELY/GOLYTELY) 420 g solution See admin instructions. (Patient not taking: Reported on 10/24/2020)   Potassium 99 MG TABS Take 1 tablet by mouth daily.   RELION INSULIN SYR 0.5ML/31G 31G X 5/16" 0.5 ML MISC as directed. (Patient not taking: No sig reported)   No current facility-administered medications for this visit. (Other)      REVIEW OF SYSTEMS:     ALLERGIES No Known Allergies  PAST MEDICAL HISTORY Past Medical History:  Diagnosis Date   Cataract    NS OU   Diabetes mellitus without complication (HCC)    Diabetic retinopathy (HCC)    NPDR OU   Hypertension    Hypertensive retinopathy    OU   Past Surgical History:  Procedure Laterality Date   EYE SURGERY  2015   OS/OD   HYSTERECTOMY ABDOMINAL WITH SALPINGECTOMY      FAMILY HISTORY Family History  Problem Relation Age of Onset   Hypertension Mother    Diabetes Maternal Aunt    Breast cancer Other     SOCIAL HISTORY Social History   Tobacco Use   Smoking status: Never   Smokeless tobacco: Never  Vaping Use   Vaping Use: Never used  Substance Use Topics   Alcohol use: No   Drug use: No  OPHTHALMIC EXAM:  Not recorded     IMAGING AND PROCEDURES  Imaging and Procedures for 09/26/17            ASSESSMENT/PLAN:  No diagnosis found.   1,2. Moderate non-proliferative diabetic retinopathy, both eyes -- stable  - no NV noted on exam or prior FA  - non central diabetic macular edema OU  - FA on 12.17.18 showed leaking microaneurysms amenable to focal laser   - S/P focal laser OS (03.20.19)  - S/P focal laser OD (04.04.19)  - FA (10.29.20) shows mild leakage from MA and peripheral vasculature OU  - DME stably improved  - BCVA remains relatively good / stable (OD 20/40, OS 20/25-2)  - no intervention indicated at this time  - f/u  in 6 months for DFE/OCT/FA (transit OD)  3,4. Hypertensive retinopathy OU  - discussed importance of tight BP control  - monitor  5. Nuclear sclerosis OU  - The symptoms of cataract, surgical options, and treatments and risks were discussed with patient.  - discussed diagnosis and progression  - under the management of expert surgeon, Dr. Zetta Bills   - clear from a retina standpoint to proceed with cataract surgery when pt and surgeon are ready    Ophthalmic Meds Ordered this visit:  No orders of the defined types were placed in this encounter.      No follow-ups on file.  There are no Patient Instructions on file for this visit.   This document serves as a record of services personally performed by Karie Chimera, MD, PhD. It was created on their behalf by De Blanch, an ophthalmic technician. The creation of this record is the provider's dictation and/or activities during the visit.    Electronically signed by: De Blanch, OA, 04/21/21  11:04 AM   Karie Chimera, M.D., Ph.D. Diseases & Surgery of the Retina and Vitreous [facility] 10/24/2020   I have reviewed the above documentation for accuracy and completeness, and I agree with the above. Karie Chimera, M.D., Ph.D. 10/24/20 11:03 AM   Abbreviations: M myopia (nearsighted); A astigmatism; H hyperopia (farsighted); P presbyopia; Mrx spectacle prescription;  CTL contact lenses; OD right eye; OS left eye; OU both eyes  XT exotropia; ET esotropia; PEK punctate epithelial keratitis; PEE punctate epithelial erosions; DES dry eye syndrome; MGD meibomian gland dysfunction; ATs artificial tears; PFAT's preservative free artificial tears; NSC nuclear sclerotic cataract; PSC posterior subcapsular cataract; ERM epi-retinal membrane; PVD posterior vitreous detachment; RD retinal detachment; DM diabetes mellitus; DR diabetic retinopathy; NPDR non-proliferative diabetic retinopathy; PDR proliferative  diabetic retinopathy; CSME clinically significant macular edema; DME diabetic macular edema; dbh dot blot hemorrhages; CWS cotton wool spot; POAG primary open angle glaucoma; C/D cup-to-disc ratio; HVF humphrey visual field; GVF goldmann visual field; OCT optical coherence tomography; IOP intraocular pressure; BRVO Branch retinal vein occlusion; CRVO central retinal vein occlusion; CRAO central retinal artery occlusion; BRAO branch retinal artery occlusion; RT retinal tear; SB scleral buckle; PPV pars plana vitrectomy; VH Vitreous hemorrhage; PRP panretinal laser photocoagulation; IVK intravitreal kenalog; VMT vitreomacular traction; MH Macular hole;  NVD neovascularization of the disc; NVE neovascularization elsewhere; AREDS age related eye disease study; ARMD age related macular degeneration; POAG primary open angle glaucoma; EBMD epithelial/anterior basement membrane dystrophy; ACIOL anterior chamber intraocular lens; IOL intraocular lens; PCIOL posterior chamber intraocular lens; Phaco/IOL phacoemulsification with intraocular lens placement; PRK photorefractive keratectomy; LASIK laser assisted in situ keratomileusis; HTN hypertension; DM diabetes mellitus; COPD chronic obstructive  pulmonary disease

## 2021-04-26 ENCOUNTER — Encounter (INDEPENDENT_AMBULATORY_CARE_PROVIDER_SITE_OTHER): Payer: HMO | Admitting: Ophthalmology

## 2021-04-26 DIAGNOSIS — H25813 Combined forms of age-related cataract, bilateral: Secondary | ICD-10-CM

## 2021-04-26 DIAGNOSIS — H35033 Hypertensive retinopathy, bilateral: Secondary | ICD-10-CM

## 2021-04-26 DIAGNOSIS — H2513 Age-related nuclear cataract, bilateral: Secondary | ICD-10-CM

## 2021-04-26 DIAGNOSIS — H3581 Retinal edema: Secondary | ICD-10-CM

## 2021-04-26 DIAGNOSIS — E113313 Type 2 diabetes mellitus with moderate nonproliferative diabetic retinopathy with macular edema, bilateral: Secondary | ICD-10-CM

## 2021-04-26 DIAGNOSIS — I10 Essential (primary) hypertension: Secondary | ICD-10-CM

## 2021-04-27 DIAGNOSIS — D509 Iron deficiency anemia, unspecified: Secondary | ICD-10-CM | POA: Diagnosis not present

## 2021-05-11 NOTE — Progress Notes (Signed)
[facility] Note  05/17/2021     CHIEF COMPLAINT Patient presents for Retina Follow Up  HISTORY OF PRESENT ILLNESS: Kimberly Hammond is a 70 y.o. female who presents to the clinic today for:   HPI     Retina Follow Up   Patient presents with  Diabetic Retinopathy.  In both eyes.  Severity is moderate.  Duration of 6 months.  Since onset it is stable.  I, the attending physician,  performed the HPI with the patient and updated documentation appropriately.        Comments   Pt here for 6 mo ret f/u for NPDR OU. Pt states vision has changed slightly, worsening a little. Pt states she saw Dr. Katy Fitch and said her cataracts were progressing, told her he'd probably operate next year. Most recent A1C level was 7.3, blood sugar not taken daily.       Last edited by Bernarda Caffey, MD on 05/17/2021 11:40 AM.    Pt feels VA is okay most times, but is blurred sometimes watching tv at night; mostly with captions.  Referring physician:  Warden Fillers, MD [address] [place],  [place] [id number]  HISTORICAL INFORMATION:   Selected notes from the MEDICAL RECORD NUMBER Referred form Dr. Shirleen Schirmer for concern of macular edema OS;  Ocular Hx- NPDR OU; cataract OU;  PMH- Type 2 DM; HTN   CURRENT MEDICATIONS: No current outpatient medications on file. (Ophthalmic Drugs)   No current facility-administered medications for this visit. (Ophthalmic Drugs)   Current Outpatient Medications (Other)  Medication Sig   aspirin 81 MG chewable tablet Chew 81 mg by mouth daily.   atorvastatin (LIPITOR) 10 MG tablet Take 10 mg by mouth daily.   ferrous sulfate 324 (65 Fe) MG TBEC Take 1 tablet by mouth daily.   glipiZIDE (GLUCOTROL XL) 5 MG 24 hr tablet TAKE 1 TABLET BY MOUTH TWICE DAILY FOR 90 DAYS   glipiZIDE (GLUCOTROL) 5 MG tablet Take 5 mg 2 (two) times daily before a meal by mouth.    insulin glargine (LANTUS) 100 UNIT/ML injection Inject 24 Units into the  skin at bedtime.   lisinopril-hydrochlorothiazide (PRINZIDE,ZESTORETIC) 20-25 MG tablet Take 1 tablet by mouth daily.   metFORMIN (GLUCOPHAGE) 1000 MG tablet TAKE 1 TABLET BY MOUTH TWICE DAILY WITH A MEAL   metoprolol succinate (TOPROL-XL) 25 MG 24 hr tablet Take 25 mg by mouth daily.   mupirocin ointment (BACTROBAN) 2 % Apply 1 application topically 2 (two) times daily.   naproxen (NAPROSYN) 500 MG tablet Take 500 mg by mouth 2 (two) times daily with a meal.   Nutritional Supplements (VITAMIN D MAINTENANCE PO) Take 1 capsule by mouth daily.   ONETOUCH VERIO test strip as directed.   Potassium 99 MG TABS Take 1 tablet by mouth daily.   benzonatate (TESSALON) 100 MG capsule Take 1 capsule (100 mg total) by mouth 3 (three) times daily as needed for cough. (Patient not taking: Reported on 05/17/2021)   metFORMIN (GLUCOPHAGE) 500 MG tablet Take 1,000 mg 2 (two) times daily with a meal by mouth.  (Patient not taking: Reported on 05/17/2021)   polyethylene glycol-electrolytes (NULYTELY/GOLYTELY) 420 g solution See admin instructions. (Patient not taking: Reported on 10/24/2020)   RELION INSULIN SYR 0.5ML/31G 31G X 5/16" 0.5 ML MISC as directed. (Patient not taking: No sig reported)   No current facility-administered medications for this visit. (Other)   REVIEW OF SYSTEMS: ROS   Positive  for: Endocrine, Eyes Negative for: Constitutional, Gastrointestinal, Neurological, Skin, Genitourinary, Musculoskeletal, HENT, Cardiovascular, Respiratory, Psychiatric, Allergic/Imm, Heme/Lymph Last edited by Kingsley Spittle, COT on 05/17/2021  8:51 AM.     ALLERGIES No Known Allergies  PAST MEDICAL HISTORY Past Medical History:  Diagnosis Date   Cataract    NS OU   Diabetes mellitus without complication ([place])    Diabetic retinopathy ([place])    NPDR OU   Hypertension    Hypertensive retinopathy    OU   Past Surgical History:  Procedure Laterality Date   EYE SURGERY  2015   OS/OD   HYSTERECTOMY  ABDOMINAL WITH SALPINGECTOMY     FAMILY HISTORY Family History  Problem Relation Age of Onset   Hypertension Mother    Diabetes Maternal Aunt    Breast cancer Other    SOCIAL HISTORY Social History   Tobacco Use   Smoking status: Never   Smokeless tobacco: Never  Vaping Use   Vaping Use: Never used  Substance Use Topics   Alcohol use: No   Drug use: No       OPHTHALMIC EXAM: Base Eye Exam     Visual Acuity (Snellen - Linear)       Right Left   Dist [place] 20/80 +1 20/40 -2   Dist ph [place] 20/30 20/25 -2         Tonometry (Tonopen, 9:01 AM)       Right Left   Pressure 14 15         Pupils       Dark Light Shape React APD   Right 3 2 Round Minimal None   Left 3 2 Round Minimal None         Visual Fields (Counting fingers)       Left Right    Full Full         Extraocular Movement       Right Left    Full, Ortho Full, Ortho         Neuro/Psych     Oriented x3: Yes   Mood/Affect: Normal         Dilation     Both eyes: 1.0% Mydriacyl, 2.5% Phenylephrine @ 9:01 AM           Slit Lamp and Fundus Exam     Slit Lamp Exam       Right Left   Lids/Lashes Dermatochalasis - upper lid, Meibomian gland dysfunction Dermatochalasis - upper lid, Meibomian gland dysfunction   Conjunctiva/Sclera White and quiet White and quiet   Cornea trace Punctate epithelial erosions, Arcus Arcus, trace PEE   Anterior Chamber Deep and quiet Deep and quiet   Iris Round and dilated, No NVI Round and dilated, No NVI   Lens 2-3+ Nuclear sclerosis with early brunescence, 2-3+ Cortical cataract, +Vacuoles, trace Posterior subcapsular cataract 2-3+ Nuclear sclerosis with brunescence, 2-3+ Cortical cataract, +Vacuoles   Anterior Vitreous Vitreous syneresis Vitreous syneresis         Fundus Exam       Right Left   Disc No NVD, Pink and Sharp, Compact, PPP/PPA trace pallor, sharp rim, Compact   C/D Ratio 0.3 0.3   Macula Flat, blunted foveal reflex, Retinal  pigment epithelial mottling, scattered MA greatest temporal macula, mild focal laser scars temporal macula Flat, blunted foveal reflex, RPE mottling, peristent focal exudates and MA's temporal mac -- slightly improved, +light focal laser scars   Vessels attenuated, mild tortuousity attenuated, mild tortuousity, peripheral attenuation temporal periphery,  focal blot heme 0400 periphery   Periphery Attached, Rare MA / DBH Attached, scattered MA, scattered RPE changes            IMAGING AND PROCEDURES  Imaging and Procedures for 09/26/17  OCT, Retina - OU - Both Eyes       Right Eye Quality was good. Central Foveal Thickness: 223. Progression has improved. Findings include normal foveal contour, no SRF, no IRF, vitreomacular adhesion (Trace non central cystic changes temporal macula and superior to disc - improved; partial PVD).   Left Eye Quality was good. Central Foveal Thickness: 223. Progression has improved. Findings include normal foveal contour, no SRF, intraretinal fluid, vitreomacular adhesion , intraretinal hyper-reflective material (Interval improvement in IRF inf/temp macula).   Notes Images taken, stored on drive  Diagnosis / Impression:  Mild DME OU, OS>OD OD- Trace non central cystic changes temporal macula and superior to disc - improved; partial PVD OS- Interval improvement in IRF inf/temp macula  Clinical management:  See below  Abbreviations: NFP - Normal foveal profile. CME - cystoid macular edema. PED - pigment epithelial detachment. IRF - intraretinal fluid. SRF - subretinal fluid. EZ - ellipsoid zone. ERM - epiretinal membrane. ORA - outer retinal atrophy. ORT - outer retinal tubulation. SRHM - subretinal hyper-reflective material       Fluorescein Angiography Optos (Transit OD)       Right Eye Progression has no prior data. Early phase findings include staining, microaneurysm. Mid/Late phase findings include microaneurysm, staining (No significant  leakage).   Left Eye Progression has no prior data. Early phase findings include microaneurysm, vascular perfusion defect. Mid/Late phase findings include microaneurysm, vascular perfusion defect, leakage (Vascular nonperfusion w/perivascular leakage temporal periphery. ).   Notes **Images stored on drive**  Impression: Moderate NPDR OU OD: mild MA w/o significant leakage OS: Vascular nonperfusion w/ perivascular leakage temporal periphery              ASSESSMENT/PLAN:    ICD-10-CM   1. Moderate nonproliferative diabetic retinopathy of both eyes with macular edema associated with type 2 diabetes mellitus ([place])  J00.9381     2. Retinal edema  H35.81 OCT, Retina - OU - Both Eyes    3. Essential hypertension  I10     4. Hypertensive retinopathy of both eyes  H35.033 Fluorescein Angiography Optos (Transit OD)    5. Combined forms of age-related cataract of both eyes  H25.813      1,2. Moderate non-proliferative diabetic retinopathy, both eyes -- stable  - no NV noted on exam or prior FA  - S/P focal laser OS (03.20.19)  - S/P focal laser OD (04.04.19)  - non central diabetic macular edema OU -- minimal and improved from prior  - FA (11.23.22) shows OD: scattered MA w/o significant leakage; OS: Vascular nonperfusion w/ perivascular leakage temporal periphery -- would benefit from segmental PRP OS  - BCVA remains relatively good / stable (OD 20/30, OS 20/25-2)  - f/u within 1 mo for segmental PRP temporal periphery OS -- pt wants Dec 21, 10 am  3,4. Hypertensive retinopathy OU  - discussed importance of tight BP control  - monitor  5. Mixed cataract OU OU  - The symptoms of cataract, surgical options, and treatments and risks were discussed with patient.  - discussed diagnosis and progression  - under the management of expert surgeon, Dr. Shirleen Schirmer   - clear from a retina standpoint to proceed with cataract surgery when pt and surgeon are ready   Ophthalmic  Meds Ordered  this visit:  No orders of the defined types were placed in this encounter.     Return in about 1 month (around 06/16/2021) for segmental PRP temporal periphery OS w/DFE&OCT.  There are no Patient Instructions on file for this visit.   This document serves as a record of services personally performed by Gardiner Sleeper, MD, PhD. It was created on their behalf by Orvan Falconer, an ophthalmic technician. The creation of this record is the provider's dictation and/or activities during the visit.    Electronically signed by: Orvan Falconer, OA, 05/17/21  11:46 AM  This document serves as a record of services personally performed by Gardiner Sleeper, MD, PhD. It was created on their behalf by [place]. Owens Shark, OA an ophthalmic technician. The creation of this record is the provider's dictation and/or activities during the visit.    Electronically signed by: San Jetty. Owens Shark, [place] 11.23.2022 11:46 AM  This document serves as a record of services personally performed by Gardiner Sleeper, MD, PhD. It was created on their behalf by Estill Bakes, COT an ophthalmic technician. The creation of this record is the provider's dictation and/or activities during the visit.    Electronically signed by: Estill Bakes, COT 11.23.22 @ 11:46 AM   Gardiner Sleeper, M.D., Ph.D. Diseases & Surgery of the Retina and [place] 11.23.22  I have reviewed the above documentation for accuracy and completeness, and I agree with the above. Gardiner Sleeper, M.D., Ph.D. 05/17/21 11:46 AM  Abbreviations: M myopia (nearsighted); A astigmatism; H hyperopia (farsighted); P presbyopia; Mrx spectacle prescription;  CTL contact lenses; OD right eye; OS left eye; OU both eyes  XT exotropia; ET esotropia; PEK punctate epithelial keratitis; PEE punctate epithelial erosions; DES dry eye syndrome; MGD meibomian gland dysfunction; ATs artificial tears; PFAT's preservative free artificial tears; [place]  nuclear sclerotic cataract; PSC posterior subcapsular cataract; ERM epi-retinal membrane; PVD posterior vitreous detachment; RD retinal detachment; DM diabetes mellitus; DR diabetic retinopathy; NPDR non-proliferative diabetic retinopathy; PDR proliferative diabetic retinopathy; CSME clinically significant macular edema; DME diabetic macular edema; dbh dot blot hemorrhages; CWS cotton wool spot; POAG primary open angle glaucoma; C/D cup-to-disc ratio; HVF humphrey visual field; GVF goldmann visual field; OCT optical coherence tomography; IOP intraocular pressure; BRVO Branch retinal vein occlusion; CRVO central retinal vein occlusion; CRAO central retinal artery occlusion; BRAO branch retinal artery occlusion; RT retinal tear; SB scleral buckle; PPV pars plana vitrectomy; VH Vitreous hemorrhage; PRP panretinal laser photocoagulation; IVK intravitreal kenalog; VMT vitreomacular traction; MH Macular hole;  NVD neovascularization of the disc; NVE neovascularization elsewhere; AREDS age related eye disease study; ARMD age related macular degeneration; POAG primary open angle glaucoma; EBMD epithelial/anterior basement membrane dystrophy; ACIOL anterior chamber intraocular lens; IOL intraocular lens; PCIOL posterior chamber intraocular lens; Phaco/IOL phacoemulsification with intraocular lens placement; [place] photorefractive keratectomy; LASIK laser assisted in situ keratomileusis; HTN hypertension; DM diabetes mellitus; COPD chronic obstructive pulmonary disease

## 2021-05-17 ENCOUNTER — Other Ambulatory Visit: Payer: Self-pay

## 2021-05-17 ENCOUNTER — Ambulatory Visit (INDEPENDENT_AMBULATORY_CARE_PROVIDER_SITE_OTHER): Payer: HMO | Admitting: Ophthalmology

## 2021-05-17 ENCOUNTER — Encounter (INDEPENDENT_AMBULATORY_CARE_PROVIDER_SITE_OTHER): Payer: Self-pay | Admitting: Ophthalmology

## 2021-05-17 DIAGNOSIS — H25813 Combined forms of age-related cataract, bilateral: Secondary | ICD-10-CM | POA: Diagnosis not present

## 2021-05-17 DIAGNOSIS — I10 Essential (primary) hypertension: Secondary | ICD-10-CM | POA: Diagnosis not present

## 2021-05-17 DIAGNOSIS — E113313 Type 2 diabetes mellitus with moderate nonproliferative diabetic retinopathy with macular edema, bilateral: Secondary | ICD-10-CM

## 2021-05-17 DIAGNOSIS — H35033 Hypertensive retinopathy, bilateral: Secondary | ICD-10-CM

## 2021-05-17 DIAGNOSIS — H3581 Retinal edema: Secondary | ICD-10-CM

## 2021-06-12 NOTE — Progress Notes (Addendum)
[facility] Note  06/14/2021     CHIEF COMPLAINT Patient presents for Retina Follow Up   HISTORY OF PRESENT ILLNESS: Kimberly Hammond is a 70 y.o. female who presents to the clinic today for:   HPI     Retina Follow Up   Patient presents with  Other.  In left eye.  This started 1 month ago.  I, the attending physician,  performed the HPI with the patient and updated documentation appropriately.        Comments   Patient here for 1 month follow up for PRP OS segmental periphary.  Patient states vision is ok. No eye pain.       Last edited by Bernarda Caffey, MD on 06/14/2021  3:50 PM.     Pt here for segmental PRP OS  Referring physician:  Lujean Amel, MD [address],  [place] [address]  HISTORICAL INFORMATION:   Selected notes from the MEDICAL RECORD NUMBER Referred form Dr. Shirleen Schirmer for concern of macular edema OS;  Ocular Hx- NPDR OU; cataract OU;  PMH- Type 2 DM; HTN   CURRENT MEDICATIONS: No current outpatient medications on file. (Ophthalmic Drugs)   No current facility-administered medications for this visit. (Ophthalmic Drugs)   Current Outpatient Medications (Other)  Medication Sig   aspirin 81 MG chewable tablet Chew 81 mg by mouth daily.   atorvastatin (LIPITOR) 10 MG tablet Take 10 mg by mouth daily.   ferrous sulfate 324 (65 Fe) MG TBEC Take 1 tablet by mouth daily.   glipiZIDE (GLUCOTROL XL) 5 MG 24 hr tablet TAKE 1 TABLET BY MOUTH TWICE DAILY FOR 90 DAYS   glipiZIDE (GLUCOTROL) 5 MG tablet Take 5 mg 2 (two) times daily before a meal by mouth.    insulin glargine (LANTUS) 100 UNIT/ML injection Inject 24 Units into the skin at bedtime.   lisinopril-hydrochlorothiazide (PRINZIDE,ZESTORETIC) 20-25 MG tablet Take 1 tablet by mouth daily.   metFORMIN (GLUCOPHAGE) 1000 MG tablet TAKE 1 TABLET BY MOUTH TWICE DAILY WITH A MEAL   metoprolol succinate (TOPROL-XL) 25 MG 24 hr tablet Take 25 mg by mouth  daily.   mupirocin ointment (BACTROBAN) 2 % Apply 1 application topically 2 (two) times daily.   naproxen (NAPROSYN) 500 MG tablet Take 500 mg by mouth 2 (two) times daily with a meal.   Nutritional Supplements (VITAMIN D MAINTENANCE PO) Take 1 capsule by mouth daily.   ONETOUCH VERIO test strip as directed.   Potassium 99 MG TABS Take 1 tablet by mouth daily.   benzonatate (TESSALON) 100 MG capsule Take 1 capsule (100 mg total) by mouth 3 (three) times daily as needed for cough. (Patient not taking: Reported on 05/17/2021)   metFORMIN (GLUCOPHAGE) 500 MG tablet Take 1,000 mg 2 (two) times daily with a meal by mouth.  (Patient not taking: Reported on 05/17/2021)   polyethylene glycol-electrolytes (NULYTELY/GOLYTELY) 420 g solution See admin instructions. (Patient not taking: Reported on 10/24/2020)   RELION INSULIN SYR 0.5ML/31G 31G X 5/16" 0.5 ML MISC as directed. (Patient not taking: Reported on 02/04/2020)   No current facility-administered medications for this visit. (Other)   REVIEW OF SYSTEMS: ROS   Positive for: Endocrine, Eyes Negative for: Constitutional, Gastrointestinal, Neurological, Skin, Genitourinary, Musculoskeletal, HENT, Cardiovascular, Respiratory, Psychiatric, Allergic/Imm, Heme/Lymph Last edited by Theodore Demark, COA on 06/14/2021  2:46 PM.     ALLERGIES No Known Allergies  PAST MEDICAL HISTORY Past Medical History:  Diagnosis Date  Cataract    NS OU   Diabetes mellitus without complication (HCC)    Diabetic retinopathy ([place])    NPDR OU   Hypertension    Hypertensive retinopathy    OU   Past Surgical History:  Procedure Laterality Date   EYE SURGERY  2015   OS/OD   HYSTERECTOMY ABDOMINAL WITH SALPINGECTOMY     FAMILY HISTORY Family History  Problem Relation Age of Onset   Hypertension Mother    Diabetes Maternal Aunt    Breast cancer Other    SOCIAL HISTORY Social History   Tobacco Use   Smoking status: Never   Smokeless tobacco: Never   Vaping Use   Vaping Use: Never used  Substance Use Topics   Alcohol use: No   Drug use: No       OPHTHALMIC EXAM: Base Eye Exam     Visual Acuity (Snellen - Linear)       Right Left   Dist [place] 20/70 -2 20/50 -1   Dist ph [place] 20/30 +2 20/20 -2         Tonometry (Tonopen, 2:43 PM)       Right Left   Pressure 11 11         Pupils       Dark Light Shape React APD   Right 3 2 Round Minimal None   Left 3 2 Round Minimal None         Visual Fields (Counting fingers)       Left Right    Full Full         Extraocular Movement       Right Left    Full, Ortho Full, Ortho         Neuro/Psych     Oriented x3: Yes   Mood/Affect: Normal         Dilation     Left eye: 1.0% Mydriacyl, 2.5% Phenylephrine @ 2:43 PM           Slit Lamp and Fundus Exam     Slit Lamp Exam       Right Left   Lids/Lashes Dermatochalasis - upper lid, Meibomian gland dysfunction Dermatochalasis - upper lid, Meibomian gland dysfunction   Conjunctiva/Sclera White and quiet White and quiet   Cornea trace Punctate epithelial erosions, Arcus Arcus, trace PEE   Anterior Chamber Deep and quiet Deep and quiet   Iris Round and dilated, No NVI Round and dilated, No NVI   Lens 2-3+ Nuclear sclerosis with early brunescence, 2-3+ Cortical cataract, +Vacuoles, trace Posterior subcapsular cataract 2-3+ Nuclear sclerosis with brunescence, 2-3+ Cortical cataract, +Vacuoles   Anterior Vitreous Vitreous syneresis Vitreous syneresis         Fundus Exam       Right Left   Disc No NVD, Pink and Sharp, Compact, PPP/PPA trace pallor, sharp rim, Compact   C/D Ratio 0.3 0.3   Macula Flat, blunted foveal reflex, Retinal pigment epithelial mottling, scattered MA greatest temporal macula, mild focal laser scars temporal macula Flat, blunted foveal reflex, RPE mottling, peristent focal exudates and MA's temporal mac -- slightly improved, +light focal laser scars   Vessels attenuated, mild  tortuousity attenuated, mild tortuousity, peripheral attenuation temporal periphery, focal blot heme 0400 periphery   Periphery Attached, Rare MA / DBH Attached, scattered MA, scattered RPE changes            IMAGING AND PROCEDURES  Imaging and Procedures for 09/26/17  OCT, Retina - OU - Both Eyes  Right Eye Quality was good. Central Foveal Thickness: 223. Progression has been stable. Findings include normal foveal contour, no SRF, no IRF, vitreomacular adhesion (Trace non central cystic changes temporal macula and superior to disc - persistent; partial PVD).   Left Eye Quality was good. Central Foveal Thickness: 219. Progression has improved. Findings include normal foveal contour, no SRF, vitreomacular adhesion , intraretinal hyper-reflective material, no IRF (Interval improvement in IRF inf/temp macula).   Notes Images taken, stored on drive  Diagnosis / Impression:  NFP OU; No frank DME OD- Trace non central cystic changes temporal macula and superior to disc - improved; partial PVD OS- stable improvement in IRF inf/temp macula  Clinical management:  See below  Abbreviations: NFP - Normal foveal profile. CME - cystoid macular edema. PED - pigment epithelial detachment. IRF - intraretinal fluid. SRF - subretinal fluid. EZ - ellipsoid zone. ERM - epiretinal membrane. ORA - outer retinal atrophy. ORT - outer retinal tubulation. SRHM - subretinal hyper-reflective material       Panretinal Photocoagulation - OS - Left Eye       Time Out Confirmed correct patient, procedure, site, and patient consented.   Anesthesia Topical anesthesia was used. Anesthetic medications included Proparacaine 0.5%.   Post-op The patient tolerated the procedure well. There were no complications. The patient received written and verbal post procedure care education.   Notes LASER PROCEDURE NOTE  Diagnosis:   Moderate NPDR w/ peripheral vascular nonperfusion, LEFT EYE  Procedure:   Pan-retinal photocoagulation using slit lamp laser, LEFT EYE  Anesthesia:  Topical  Surgeon: Bernarda Caffey, MD, PhD   Informed consent obtained, operative eye marked, and time out performed prior to initiation of laser.   Lumenis [id number] slit lamp laser Pattern: 3x3 square Power: 290 mW Duration: 30 msec  Spot size: 200 microns  # spots: 492 spots temporal periphery to areas of vascular nonperfusion  Complications: None.  RTC: 6-8 wks -- DFE/OCT  Patient tolerated the procedure well and received written and verbal post-procedure care information/education.            ASSESSMENT/PLAN:    ICD-10-CM   1. Moderate nonproliferative diabetic retinopathy of both eyes with macular edema associated with type 2 diabetes mellitus (HCC)  E11.3313 OCT, Retina - OU - Both Eyes    Panretinal Photocoagulation - OS - Left Eye    2. Essential hypertension  I10     3. Hypertensive retinopathy of both eyes  H35.033     4. Combined forms of age-related cataract of both eyes  H25.813       1. Moderate non-proliferative diabetic retinopathy, both eyes -- stable  - no NV noted on exam or prior FA  - S/P focal laser OS (03.20.19)  - S/P focal laser OD (04.04.19)  - non central diabetic macular edema OU -- essentially resolved now  - FA (11.23.22) shows OD: scattered MA w/o significant leakage; OS: Vascular nonperfusion w/ perivascular leakage temporal periphery -- would benefit from segmental PRP OS  - BCVA remains relatively good / stable (OD 20/30, OS 20/25-2)  - recommend segmental PRP temporal periphery OS today, 12.21.22  - pt wishes to proceed  - RBA of procedure discussed, questions answered - informed consent obtained and signed - see procedure note - start Lotemax SM QID x7 days - f/u 6-8 wks -- DFE/OCT  2,3. Hypertensive retinopathy OU  - discussed importance of tight BP control  - monitor  4. Mixed cataract OU OU  - The symptoms of cataract,  surgical options, and  treatments and risks were discussed with patient.  - discussed diagnosis and progression  - under the management of expert surgeon, Dr. Shirleen Schirmer   - clear from a retina standpoint to proceed with cataract surgery when pt and surgeon are ready   Ophthalmic Meds Ordered this visit:  No orders of the defined types were placed in this encounter.    Return for 6-8 wks -- mod NPDR OU -- Dilated Exam, OCT.  There are no Patient Instructions on file for this visit.   This document serves as a record of services personally performed by Gardiner Sleeper, MD, PhD. It was created on their behalf by Orvan Falconer, an ophthalmic technician. The creation of this record is the provider's dictation and/or activities during the visit.    Electronically signed by: Orvan Falconer, OA, 06/14/21  4:02 PM  This document serves as a record of services personally performed by Gardiner Sleeper, MD, PhD. It was created on their behalf by San Jetty. Owens Shark, OA an ophthalmic technician. The creation of this record is the provider's dictation and/or activities during the visit.    Electronically signed by: San Jetty. Owens Shark, [place] 12.21.2022 4:02 PM  Gardiner Sleeper, M.D., Ph.D. Diseases & Surgery of the Retina and Vitreous [facility]  I have reviewed the above documentation for accuracy and completeness, and I agree with the above. Gardiner Sleeper, M.D., Ph.D. 06/14/21 4:02 PM  Abbreviations: M myopia (nearsighted); A astigmatism; H hyperopia (farsighted); P presbyopia; Mrx spectacle prescription;  CTL contact lenses; OD right eye; OS left eye; OU both eyes  XT exotropia; ET esotropia; PEK punctate epithelial keratitis; PEE punctate epithelial erosions; DES dry eye syndrome; MGD meibomian gland dysfunction; ATs artificial tears; PFAT's preservative free artificial tears; [place] nuclear sclerotic cataract; PSC posterior subcapsular cataract; ERM epi-retinal membrane; PVD posterior vitreous detachment;  RD retinal detachment; DM diabetes mellitus; DR diabetic retinopathy; NPDR non-proliferative diabetic retinopathy; PDR proliferative diabetic retinopathy; CSME clinically significant macular edema; DME diabetic macular edema; dbh dot blot hemorrhages; CWS cotton wool spot; POAG primary open angle glaucoma; C/D cup-to-disc ratio; HVF humphrey visual field; GVF goldmann visual field; OCT optical coherence tomography; IOP intraocular pressure; BRVO Branch retinal vein occlusion; CRVO central retinal vein occlusion; CRAO central retinal artery occlusion; BRAO branch retinal artery occlusion; RT retinal tear; SB scleral buckle; PPV pars plana vitrectomy; VH Vitreous hemorrhage; PRP panretinal laser photocoagulation; IVK intravitreal kenalog; VMT vitreomacular traction; MH Macular hole;  NVD neovascularization of the disc; NVE neovascularization elsewhere; AREDS age related eye disease study; ARMD age related macular degeneration; POAG primary open angle glaucoma; EBMD epithelial/anterior basement membrane dystrophy; ACIOL anterior chamber intraocular lens; IOL intraocular lens; PCIOL posterior chamber intraocular lens; Phaco/IOL phacoemulsification with intraocular lens placement; [place] photorefractive keratectomy; LASIK laser assisted in situ keratomileusis; HTN hypertension; DM diabetes mellitus; COPD chronic obstructive pulmonary disease

## 2021-06-14 ENCOUNTER — Encounter (INDEPENDENT_AMBULATORY_CARE_PROVIDER_SITE_OTHER): Payer: Self-pay | Admitting: Ophthalmology

## 2021-06-14 ENCOUNTER — Encounter (INDEPENDENT_AMBULATORY_CARE_PROVIDER_SITE_OTHER): Payer: HMO | Admitting: Ophthalmology

## 2021-06-14 ENCOUNTER — Ambulatory Visit (INDEPENDENT_AMBULATORY_CARE_PROVIDER_SITE_OTHER): Payer: HMO | Admitting: Ophthalmology

## 2021-06-14 ENCOUNTER — Other Ambulatory Visit: Payer: Self-pay

## 2021-06-14 DIAGNOSIS — H35033 Hypertensive retinopathy, bilateral: Secondary | ICD-10-CM | POA: Diagnosis not present

## 2021-06-14 DIAGNOSIS — H25813 Combined forms of age-related cataract, bilateral: Secondary | ICD-10-CM

## 2021-06-14 DIAGNOSIS — I10 Essential (primary) hypertension: Secondary | ICD-10-CM

## 2021-06-14 DIAGNOSIS — E113313 Type 2 diabetes mellitus with moderate nonproliferative diabetic retinopathy with macular edema, bilateral: Secondary | ICD-10-CM

## 2021-06-22 DIAGNOSIS — I10 Essential (primary) hypertension: Secondary | ICD-10-CM | POA: Diagnosis not present

## 2021-06-22 DIAGNOSIS — Z79899 Other long term (current) drug therapy: Secondary | ICD-10-CM | POA: Diagnosis not present

## 2021-06-22 DIAGNOSIS — Z0001 Encounter for general adult medical examination with abnormal findings: Secondary | ICD-10-CM | POA: Diagnosis not present

## 2021-06-22 DIAGNOSIS — E7800 Pure hypercholesterolemia, unspecified: Secondary | ICD-10-CM | POA: Diagnosis not present

## 2021-06-22 DIAGNOSIS — Z23 Encounter for immunization: Secondary | ICD-10-CM | POA: Diagnosis not present

## 2021-06-22 DIAGNOSIS — D509 Iron deficiency anemia, unspecified: Secondary | ICD-10-CM | POA: Diagnosis not present

## 2021-06-22 DIAGNOSIS — E113393 Type 2 diabetes mellitus with moderate nonproliferative diabetic retinopathy without macular edema, bilateral: Secondary | ICD-10-CM | POA: Diagnosis not present

## 2021-06-22 DIAGNOSIS — E11319 Type 2 diabetes mellitus with unspecified diabetic retinopathy without macular edema: Secondary | ICD-10-CM | POA: Diagnosis not present

## 2021-06-27 DIAGNOSIS — E1165 Type 2 diabetes mellitus with hyperglycemia: Secondary | ICD-10-CM | POA: Diagnosis not present

## 2021-06-27 DIAGNOSIS — E7800 Pure hypercholesterolemia, unspecified: Secondary | ICD-10-CM | POA: Diagnosis not present

## 2021-07-03 DIAGNOSIS — I10 Essential (primary) hypertension: Secondary | ICD-10-CM | POA: Diagnosis not present

## 2021-07-03 DIAGNOSIS — E1165 Type 2 diabetes mellitus with hyperglycemia: Secondary | ICD-10-CM | POA: Diagnosis not present

## 2021-07-03 DIAGNOSIS — E669 Obesity, unspecified: Secondary | ICD-10-CM | POA: Diagnosis not present

## 2021-07-03 DIAGNOSIS — E7800 Pure hypercholesterolemia, unspecified: Secondary | ICD-10-CM | POA: Diagnosis not present

## 2021-07-20 NOTE — Progress Notes (Shared)
[facility] - Clinic Note  07/26/2021     CHIEF COMPLAINT Patient presents for No chief complaint on file.   HISTORY OF PRESENT ILLNESS: Kimberly Hammond is a 71 y.o. female who presents to the clinic today for:     Referring physician:  Darrow Bussing, MD [address] [place],  [place] [address]  HISTORICAL INFORMATION:   Selected notes from the MEDICAL RECORD NUMBER Referred form Dr. Zetta Bills for concern of macular edema OS;  Ocular Hx- NPDR OU; cataract OU;  PMH- Type 2 DM; HTN   CURRENT MEDICATIONS: No current outpatient medications on file. (Ophthalmic Drugs)   No current facility-administered medications for this visit. (Ophthalmic Drugs)   Current Outpatient Medications (Other)  Medication Sig   aspirin 81 MG chewable tablet Chew 81 mg by mouth daily.   atorvastatin (LIPITOR) 10 MG tablet Take 10 mg by mouth daily.   benzonatate (TESSALON) 100 MG capsule Take 1 capsule (100 mg total) by mouth 3 (three) times daily as needed for cough. (Patient not taking: Reported on 05/17/2021)   ferrous sulfate 324 (65 Fe) MG TBEC Take 1 tablet by mouth daily.   glipiZIDE (GLUCOTROL XL) 5 MG 24 hr tablet TAKE 1 TABLET BY MOUTH TWICE DAILY FOR 90 DAYS   glipiZIDE (GLUCOTROL) 5 MG tablet Take 5 mg 2 (two) times daily before a meal by mouth.    insulin glargine (LANTUS) 100 UNIT/ML injection Inject 24 Units into the skin at bedtime.   lisinopril-hydrochlorothiazide (PRINZIDE,ZESTORETIC) 20-25 MG tablet Take 1 tablet by mouth daily.   metFORMIN (GLUCOPHAGE) 1000 MG tablet TAKE 1 TABLET BY MOUTH TWICE DAILY WITH A MEAL   metFORMIN (GLUCOPHAGE) 500 MG tablet Take 1,000 mg 2 (two) times daily with a meal by mouth.  (Patient not taking: Reported on 05/17/2021)   metoprolol succinate (TOPROL-XL) 25 MG 24 hr tablet Take 25 mg by mouth daily.   mupirocin ointment (BACTROBAN) 2 % Apply 1 application topically 2 (two) times daily.   naproxen (NAPROSYN) 500 MG  tablet Take 500 mg by mouth 2 (two) times daily with a meal.   Nutritional Supplements (VITAMIN D MAINTENANCE PO) Take 1 capsule by mouth daily.   ONETOUCH VERIO test strip as directed.   polyethylene glycol-electrolytes (NULYTELY/GOLYTELY) 420 g solution See admin instructions. (Patient not taking: Reported on 10/24/2020)   Potassium 99 MG TABS Take 1 tablet by mouth daily.   RELION INSULIN SYR 0.5ML/31G 31G X 5/16" 0.5 ML MISC as directed. (Patient not taking: Reported on 02/04/2020)   No current facility-administered medications for this visit. (Other)   REVIEW OF SYSTEMS:   ALLERGIES No Known Allergies  PAST MEDICAL HISTORY Past Medical History:  Diagnosis Date   Cataract    NS OU   Diabetes mellitus without complication (HCC)    Diabetic retinopathy (HCC)    NPDR OU   Hypertension    Hypertensive retinopathy    OU   Past Surgical History:  Procedure Laterality Date   EYE SURGERY  2015   OS/OD   HYSTERECTOMY ABDOMINAL WITH SALPINGECTOMY     FAMILY HISTORY Family History  Problem Relation Age of Onset   Hypertension Mother    Diabetes Maternal Aunt    Breast cancer Other    SOCIAL HISTORY Social History   Tobacco Use   Smoking status: Never   Smokeless tobacco: Never  Vaping Use   Vaping Use: Never used  Substance Use Topics   Alcohol use: No  Drug use: No       OPHTHALMIC EXAM: Not recorded     IMAGING AND PROCEDURES  Imaging and Procedures for 09/26/17          ASSESSMENT/PLAN:  No diagnosis found.   1. Moderate non-proliferative diabetic retinopathy, both eyes -- stable  - no NV noted on exam or prior FA  - S/P focal laser OS (03.20.19)  - S/P focal laser OD (04.04.19)  - non central diabetic macular edema OU -- essentially resolved now  - FA (11.23.22) shows OD: scattered MA w/o significant leakage; OS: Vascular nonperfusion w/ perivascular leakage temporal periphery -- would benefit from segmental PRP OS  - BCVA remains relatively  good / stable (OD 20/30, OS 20/25-2)  - S/P segmental PRP temporal periphery OS (12.21.22)  - pt wishes to proceed  - RBA of procedure discussed, questions answered - informed consent obtained and signed - see procedure note - start Lotemax SM QID x7 days - f/u 6-8 wks -- DFE/OCT  2,3. Hypertensive retinopathy OU  - discussed importance of tight BP control  - monitor  4. Mixed cataract OU OU  - The symptoms of cataract, surgical options, and treatments and risks were discussed with patient.  - discussed diagnosis and progression  - under the management of expert surgeon, Dr. Zetta Bills   - clear from a retina standpoint to proceed with cataract surgery when pt and surgeon are ready   Ophthalmic Meds Ordered this visit:  No orders of the defined types were placed in this encounter.    No follow-ups on file.  There are no Patient Instructions on file for this visit.   This document serves as a record of services personally performed by Karie Chimera, MD, PhD. It was created on their behalf by De Blanch, an ophthalmic technician. The creation of this record is the provider's dictation and/or activities during the visit.    Electronically signed by: De Blanch, OA, 07/20/21  10:25 AM    Karie Chimera, M.D., Ph.D. Diseases & Surgery of the Retina and Vitreous [facility]  I have reviewed the above documentation for accuracy and completeness, and I agree with the above. Karie Chimera, M.D., Ph.D. 06/14/21 10:25 AM  Abbreviations: M myopia (nearsighted); A astigmatism; H hyperopia (farsighted); P presbyopia; Mrx spectacle prescription;  CTL contact lenses; OD right eye; OS left eye; OU both eyes  XT exotropia; ET esotropia; PEK punctate epithelial keratitis; PEE punctate epithelial erosions; DES dry eye syndrome; MGD meibomian gland dysfunction; ATs artificial tears; PFAT's preservative free artificial tears; [place] nuclear sclerotic cataract; PSC  posterior subcapsular cataract; ERM epi-retinal membrane; PVD posterior vitreous detachment; RD retinal detachment; DM diabetes mellitus; DR diabetic retinopathy; NPDR non-proliferative diabetic retinopathy; PDR proliferative diabetic retinopathy; CSME clinically significant macular edema; DME diabetic macular edema; dbh dot blot hemorrhages; CWS cotton wool spot; POAG primary open angle glaucoma; C/D cup-to-disc ratio; HVF humphrey visual field; GVF goldmann visual field; OCT optical coherence tomography; IOP intraocular pressure; BRVO Branch retinal vein occlusion; CRVO central retinal vein occlusion; CRAO central retinal artery occlusion; BRAO branch retinal artery occlusion; RT retinal tear; SB scleral buckle; PPV pars plana vitrectomy; VH Vitreous hemorrhage; PRP panretinal laser photocoagulation; IVK intravitreal kenalog; VMT vitreomacular traction; MH Macular hole;  NVD neovascularization of the disc; NVE neovascularization elsewhere; AREDS age related eye disease study; ARMD age related macular degeneration; POAG primary open angle glaucoma; EBMD epithelial/anterior basement membrane dystrophy; ACIOL anterior chamber intraocular lens; IOL intraocular  lens; PCIOL posterior chamber intraocular lens; Phaco/IOL phacoemulsification with intraocular lens placement; [place] photorefractive keratectomy; LASIK laser assisted in situ keratomileusis; HTN hypertension; DM diabetes mellitus; COPD chronic obstructive pulmonary disease

## 2021-07-26 ENCOUNTER — Encounter (INDEPENDENT_AMBULATORY_CARE_PROVIDER_SITE_OTHER): Payer: HMO | Admitting: Ophthalmology

## 2021-07-26 DIAGNOSIS — E113313 Type 2 diabetes mellitus with moderate nonproliferative diabetic retinopathy with macular edema, bilateral: Secondary | ICD-10-CM

## 2021-07-26 DIAGNOSIS — I10 Essential (primary) hypertension: Secondary | ICD-10-CM

## 2021-07-26 DIAGNOSIS — H35033 Hypertensive retinopathy, bilateral: Secondary | ICD-10-CM

## 2021-07-26 DIAGNOSIS — H25813 Combined forms of age-related cataract, bilateral: Secondary | ICD-10-CM

## 2021-07-26 NOTE — Progress Notes (Addendum)
[facility] Note  07/31/2021     CHIEF COMPLAINT Patient presents for Retina Follow Up   HISTORY OF PRESENT ILLNESS: Kimberly Hammond is a 71 y.o. female who presents to the clinic today for:   HPI     Retina Follow Up   Patient presents with  Diabetic Retinopathy.  In both eyes.  This started 7.  I, the attending physician,  performed the HPI with the patient and updated documentation appropriately.        Comments   Patient here for 7 weeks retina follow up for NPDR OU. Patient states vision doing ok. No eye pain.       Last edited by Bernarda Caffey, MD on 08/02/2021 12:20 PM.      Referring physician:  Lujean Amel, MD [address],  [place] [address]  HISTORICAL INFORMATION:   Selected notes from the MEDICAL RECORD NUMBER Referred form Dr. Shirleen Schirmer for concern of macular edema OS;  Ocular Hx- NPDR OU; cataract OU;  PMH- Type 2 DM; HTN   CURRENT MEDICATIONS: No current outpatient medications on file. (Ophthalmic Drugs)   No current facility-administered medications for this visit. (Ophthalmic Drugs)   Current Outpatient Medications (Other)  Medication Sig   aspirin 81 MG chewable tablet Chew 81 mg by mouth daily.   atorvastatin (LIPITOR) 10 MG tablet Take 10 mg by mouth daily.   ferrous sulfate 324 (65 Fe) MG TBEC Take 1 tablet by mouth daily.   glipiZIDE (GLUCOTROL XL) 5 MG 24 hr tablet TAKE 1 TABLET BY MOUTH TWICE DAILY FOR 90 DAYS   glipiZIDE (GLUCOTROL) 5 MG tablet Take 5 mg 2 (two) times daily before a meal by mouth.    insulin glargine (LANTUS) 100 UNIT/ML injection Inject 24 Units into the skin at bedtime.   lisinopril-hydrochlorothiazide (PRINZIDE,ZESTORETIC) 20-25 MG tablet Take 1 tablet by mouth daily.   metFORMIN (GLUCOPHAGE) 1000 MG tablet TAKE 1 TABLET BY MOUTH TWICE DAILY WITH A MEAL   metoprolol succinate (TOPROL-XL) 25 MG 24 hr tablet Take 25 mg by mouth daily.   mupirocin ointment  (BACTROBAN) 2 % Apply 1 application topically 2 (two) times daily.   naproxen (NAPROSYN) 500 MG tablet Take 500 mg by mouth 2 (two) times daily with a meal.   Nutritional Supplements (VITAMIN D MAINTENANCE PO) Take 1 capsule by mouth daily.   ONETOUCH VERIO test strip as directed.   Potassium 99 MG TABS Take 1 tablet by mouth daily.   benzonatate (TESSALON) 100 MG capsule Take 1 capsule (100 mg total) by mouth 3 (three) times daily as needed for cough. (Patient not taking: Reported on 05/17/2021)   metFORMIN (GLUCOPHAGE) 500 MG tablet Take 1,000 mg 2 (two) times daily with a meal by mouth.  (Patient not taking: Reported on 05/17/2021)   polyethylene glycol-electrolytes (NULYTELY/GOLYTELY) 420 g solution See admin instructions. (Patient not taking: Reported on 10/24/2020)   RELION INSULIN SYR 0.5ML/31G 31G X 5/16" 0.5 ML MISC as directed. (Patient not taking: Reported on 02/04/2020)   No current facility-administered medications for this visit. (Other)   REVIEW OF SYSTEMS: ROS   Positive for: Endocrine, Eyes Negative for: Constitutional, Gastrointestinal, Neurological, Skin, Genitourinary, Musculoskeletal, HENT, Cardiovascular, Respiratory, Psychiatric, Allergic/Imm, Heme/Lymph Last edited by Theodore Demark, COA on 07/31/2021  8:42 AM.     ALLERGIES No Known Allergies  PAST MEDICAL HISTORY Past Medical History:  Diagnosis Date   Cataract    NS OU  Diabetes mellitus without complication (HCC)    Diabetic retinopathy ([place])    NPDR OU   Hypertension    Hypertensive retinopathy    OU   Past Surgical History:  Procedure Laterality Date   EYE SURGERY  2015   OS/OD   HYSTERECTOMY ABDOMINAL WITH SALPINGECTOMY     FAMILY HISTORY Family History  Problem Relation Age of Onset   Hypertension Mother    Diabetes Maternal Aunt    Breast cancer Other    SOCIAL HISTORY Social History   Tobacco Use   Smoking status: Never   Smokeless tobacco: Never  Vaping Use   Vaping Use: Never  used  Substance Use Topics   Alcohol use: No   Drug use: No       OPHTHALMIC EXAM: Base Eye Exam     Visual Acuity (Snellen - Linear)       Right Left   Dist [place] 20/70 -2 20/50   Dist ph [place] 20/30 20/20 -1         Tonometry (Tonopen, 8:39 AM)       Right Left   Pressure 13 11         Pupils       Dark Light Shape React APD   Right 3 2 Round Minimal None   Left 3 2 Round Minimal None         Visual Fields (Counting fingers)       Left Right    Full Full         Extraocular Movement       Right Left    Full, Ortho Full, Ortho         Neuro/Psych     Oriented x3: Yes   Mood/Affect: Normal         Dilation     Both eyes: 1.0% Mydriacyl, 2.5% Phenylephrine @ 8:39 AM           Slit Lamp and Fundus Exam     Slit Lamp Exam       Right Left   Lids/Lashes Dermatochalasis - upper lid, Meibomian gland dysfunction Dermatochalasis - upper lid, Meibomian gland dysfunction   Conjunctiva/Sclera White and quiet White and quiet   Cornea trace Punctate epithelial erosions, Arcus Arcus, trace PEE   Anterior Chamber Deep and quiet Deep and quiet   Iris Round and dilated, No NVI Round and dilated, No NVI   Lens 2-3+ Nuclear sclerosis with early brunescence, 2-3+ Cortical cataract, +Vacuoles, trace Posterior subcapsular cataract 2-3+ Nuclear sclerosis with brunescence, 2-3+ Cortical cataract, +Vacuoles   Anterior Vitreous Vitreous syneresis Vitreous syneresis         Fundus Exam       Right Left   Disc No NVD, Pink and Sharp, Compact, PPP/PPA trace pallor, sharp rim, Compact, PPP   C/D Ratio 0.3 0.3   Macula Flat, blunted foveal reflex, Retinal pigment epithelial mottling, scattered MA greatest temporal macula, mild focal laser scars temporal macula Flat, blunted foveal reflex, RPE mottling, peristent focal exudates and MA's temporal mac -- slightly improved, +light focal laser scars   Vessels attenuated, mild tortuousity attenuated, Tortuous    Periphery Attached, Rare MA / DBH Attached, scattered MA, scattered RPE changes, good segmental PRP temporal periphery            IMAGING AND PROCEDURES  Imaging and Procedures for 09/26/17  OCT, Retina - OU - Both Eyes       Right Eye Quality was good. Central Foveal Thickness: 221.  Progression has been stable. Findings include normal foveal contour, no SRF, no IRF, vitreomacular adhesion (Trace non central cystic changes temporal macula and superior to disc - persistent; seen best on widefield, partial PVD).   Left Eye Quality was good. Central Foveal Thickness: 219. Progression has improved. Findings include normal foveal contour, no SRF, vitreomacular adhesion , intraretinal hyper-reflective material, no IRF (Interval improvement in trace cystic changes/IRF and IRHM inf/temp macula).   Notes Images taken, stored on drive  Diagnosis / Impression:  NFP OU; No frank DME OD- Trace non central cystic changes temporal macula and superior to disc - persistent, seen best on widefield; partial PVD OS- Interval improvement in trace cystic changes/IRF and IRHM inf/temp macula  Clinical management:  See below  Abbreviations: NFP - Normal foveal profile. CME - cystoid macular edema. PED - pigment epithelial detachment. IRF - intraretinal fluid. SRF - subretinal fluid. EZ - ellipsoid zone. ERM - epiretinal membrane. ORA - outer retinal atrophy. ORT - outer retinal tubulation. SRHM - subretinal hyper-reflective material             ASSESSMENT/PLAN:    ICD-10-CM   1. Moderate nonproliferative diabetic retinopathy of both eyes with macular edema associated with type 2 diabetes mellitus (HCC)  E11.3313 OCT, Retina - OU - Both Eyes    2. Essential hypertension  I10     3. Hypertensive retinopathy of both eyes  H35.033     4. Combined forms of age-related cataract of both eyes  H25.813        1. Moderate non-proliferative diabetic retinopathy, both eyes -- stable  - no NV noted  on exam or prior FA  - S/P focal laser OS (03.20.19)  - S/P focal laser OD (04.04.19)  - s/p segmental PRP OS temporal periphery (12.21.22) for vascular nonperfusion  - non central diabetic macular edema OU -- essentially resolved now  - FA (11.23.22) shows OD: scattered MA w/o significant leakage; OS: Vascular nonperfusion w/ perivascular leakage temporal periphery -- s/p segmental PRP (12.21.22) as above  - BCVA remains good / stable (OD 20/30, OS 20/20) - f/u 4 months -- DFE/OCT, repeat FA (transit OD)  2,3. Hypertensive retinopathy OU  - discussed importance of tight BP control  - monitor  4. Mixed cataract OU OU  - The symptoms of cataract, surgical options, and treatments and risks were discussed with patient.  - discussed diagnosis and progression  - under the management of expert surgeon, Dr. Shirleen Schirmer   - clear from a retina standpoint to proceed with cataract surgery when both patient and surgeon are ready  Ophthalmic Meds Ordered this visit:  No orders of the defined types were placed in this encounter.    Return in about 4 months (around 11/28/2021) for f/u NPDR OU, DFE, OCT, FA.  There are no Patient Instructions on file for this visit.   This document serves as a record of services personally performed by Gardiner Sleeper, MD, PhD. It was created on their behalf by Roselee Nova, COMT. The creation of this record is the provider's dictation and/or activities during the visit.  Electronically signed by: Roselee Nova, COMT 08/02/21 12:23 PM  This document serves as a record of services personally performed by Gardiner Sleeper, MD, PhD. It was created on their behalf by San Jetty. Owens Shark, OA an ophthalmic technician. The creation of this record is the provider's dictation and/or activities during the visit.    Electronically signed by: San Jetty. Owens Shark, [place] 02.06.2023 12:23 PM  Gardiner Sleeper, M.D., Ph.D. Diseases & Surgery of the Retina and Vitreous [facility]  I have reviewed the above documentation for accuracy and completeness, and I agree with the above. Gardiner Sleeper, M.D., Ph.D. 08/02/21 12:23 PM   Abbreviations: M myopia (nearsighted); A astigmatism; H hyperopia (farsighted); P presbyopia; Mrx spectacle prescription;  CTL contact lenses; OD right eye; OS left eye; OU both eyes  XT exotropia; ET esotropia; PEK punctate epithelial keratitis; PEE punctate epithelial erosions; DES dry eye syndrome; MGD meibomian gland dysfunction; ATs artificial tears; PFAT's preservative free artificial tears; [place] nuclear sclerotic cataract; PSC posterior subcapsular cataract; ERM epi-retinal membrane; PVD posterior vitreous detachment; RD retinal detachment; DM diabetes mellitus; DR diabetic retinopathy; NPDR non-proliferative diabetic retinopathy; PDR proliferative diabetic retinopathy; CSME clinically significant macular edema; DME diabetic macular edema; dbh dot blot hemorrhages; CWS cotton wool spot; POAG primary open angle glaucoma; C/D cup-to-disc ratio; HVF humphrey visual field; GVF goldmann visual field; OCT optical coherence tomography; IOP intraocular pressure; BRVO Branch retinal vein occlusion; CRVO central retinal vein occlusion; CRAO central retinal artery occlusion; BRAO branch retinal artery occlusion; RT retinal tear; SB scleral buckle; PPV pars plana vitrectomy; VH Vitreous hemorrhage; PRP panretinal laser photocoagulation; IVK intravitreal kenalog; VMT vitreomacular traction; MH Macular hole;  NVD neovascularization of the disc; NVE neovascularization elsewhere; AREDS age related eye disease study; ARMD age related macular degeneration; POAG primary open angle glaucoma; EBMD epithelial/anterior basement membrane dystrophy; ACIOL anterior chamber intraocular lens; IOL intraocular lens; PCIOL posterior chamber intraocular lens; Phaco/IOL phacoemulsification with intraocular lens placement; [place] photorefractive keratectomy; LASIK laser assisted in  situ keratomileusis; HTN hypertension; DM diabetes mellitus; COPD chronic obstructive pulmonary disease

## 2021-07-31 ENCOUNTER — Other Ambulatory Visit: Payer: Self-pay

## 2021-07-31 ENCOUNTER — Ambulatory Visit (INDEPENDENT_AMBULATORY_CARE_PROVIDER_SITE_OTHER): Payer: HMO | Admitting: Ophthalmology

## 2021-07-31 ENCOUNTER — Encounter (INDEPENDENT_AMBULATORY_CARE_PROVIDER_SITE_OTHER): Payer: Self-pay | Admitting: Ophthalmology

## 2021-07-31 DIAGNOSIS — I10 Essential (primary) hypertension: Secondary | ICD-10-CM

## 2021-07-31 DIAGNOSIS — E113313 Type 2 diabetes mellitus with moderate nonproliferative diabetic retinopathy with macular edema, bilateral: Secondary | ICD-10-CM | POA: Diagnosis not present

## 2021-07-31 DIAGNOSIS — H35033 Hypertensive retinopathy, bilateral: Secondary | ICD-10-CM

## 2021-07-31 DIAGNOSIS — H25813 Combined forms of age-related cataract, bilateral: Secondary | ICD-10-CM

## 2021-08-02 ENCOUNTER — Encounter (INDEPENDENT_AMBULATORY_CARE_PROVIDER_SITE_OTHER): Payer: Self-pay | Admitting: Ophthalmology

## 2021-08-22 DIAGNOSIS — R194 Change in bowel habit: Secondary | ICD-10-CM | POA: Diagnosis not present

## 2021-11-21 ENCOUNTER — Other Ambulatory Visit: Payer: Self-pay | Admitting: Family Medicine

## 2021-11-21 ENCOUNTER — Other Ambulatory Visit: Payer: Self-pay | Admitting: Obstetrics and Gynecology

## 2021-11-21 DIAGNOSIS — Z1231 Encounter for screening mammogram for malignant neoplasm of breast: Secondary | ICD-10-CM

## 2021-11-28 ENCOUNTER — Encounter (INDEPENDENT_AMBULATORY_CARE_PROVIDER_SITE_OTHER): Payer: HMO | Admitting: Ophthalmology

## 2021-11-28 NOTE — Progress Notes (Signed)
[facility] Note  12/01/2021     CHIEF COMPLAINT Patient presents for Retina Follow Up   HISTORY OF PRESENT ILLNESS: Kimberly BEYERSDORF is a 71 y.o. female who presents to the clinic today for:   HPI     Retina Follow Up   Patient presents with  Diabetic Retinopathy.  In both eyes.  This started 4 months ago.  I, the attending physician,  performed the HPI with the patient and updated documentation appropriately.        Comments   Patient here for 4 months retina follow up for NPDR OU. Patient states vision doing ok. No eye pain.       Last edited by Bernarda Caffey, MD on 12/02/2021  2:13 AM.    Pt states vision is stable, pt had an appt with Dr. Katy Fitch next month   Referring physician:  Warden Fillers, MD [address] [place],  [place] [address]  HISTORICAL INFORMATION:   Selected notes from the MEDICAL RECORD NUMBER Referred form Dr. Shirleen Schirmer for concern of macular edema OS;  Ocular Hx- NPDR OU; cataract OU;  PMH- Type 2 DM; HTN   CURRENT MEDICATIONS: No current outpatient medications on file. (Ophthalmic Drugs)   No current facility-administered medications for this visit. (Ophthalmic Drugs)   Current Outpatient Medications (Other)  Medication Sig   aspirin 81 MG chewable tablet Chew 81 mg by mouth daily.   atorvastatin (LIPITOR) 10 MG tablet Take 10 mg by mouth daily.   ferrous sulfate 324 (65 Fe) MG TBEC Take 1 tablet by mouth daily.   glipiZIDE (GLUCOTROL XL) 5 MG 24 hr tablet TAKE 1 TABLET BY MOUTH TWICE DAILY FOR 90 DAYS   glipiZIDE (GLUCOTROL) 5 MG tablet Take 5 mg 2 (two) times daily before a meal by mouth.    insulin glargine (LANTUS) 100 UNIT/ML injection Inject 24 Units into the skin at bedtime.   lisinopril-hydrochlorothiazide (PRINZIDE,ZESTORETIC) 20-25 MG tablet Take 1 tablet by mouth daily.   metFORMIN (GLUCOPHAGE) 1000 MG tablet TAKE 1 TABLET BY MOUTH TWICE DAILY WITH A MEAL   metoprolol succinate (TOPROL-XL)  25 MG 24 hr tablet Take 25 mg by mouth daily.   mupirocin ointment (BACTROBAN) 2 % Apply 1 application topically 2 (two) times daily.   naproxen (NAPROSYN) 500 MG tablet Take 500 mg by mouth 2 (two) times daily with a meal.   Nutritional Supplements (VITAMIN D MAINTENANCE PO) Take 1 capsule by mouth daily.   ONETOUCH VERIO test strip as directed.   Potassium 99 MG TABS Take 1 tablet by mouth daily.   benzonatate (TESSALON) 100 MG capsule Take 1 capsule (100 mg total) by mouth 3 (three) times daily as needed for cough. (Patient not taking: Reported on 05/17/2021)   metFORMIN (GLUCOPHAGE) 500 MG tablet Take 1,000 mg 2 (two) times daily with a meal by mouth.  (Patient not taking: Reported on 05/17/2021)   polyethylene glycol-electrolytes (NULYTELY/GOLYTELY) 420 g solution See admin instructions. (Patient not taking: Reported on 10/24/2020)   RELION INSULIN SYR 0.5ML/31G 31G X 5/16" 0.5 ML MISC as directed. (Patient not taking: Reported on 02/04/2020)   No current facility-administered medications for this visit. (Other)   REVIEW OF SYSTEMS: ROS   Positive for: Endocrine, Eyes Negative for: Constitutional, Gastrointestinal, Neurological, Skin, Genitourinary, Musculoskeletal, HENT, Cardiovascular, Respiratory, Psychiatric, Allergic/Imm, Heme/Lymph Last edited by Theodore Demark, COA on 12/01/2021  8:42 AM.     ALLERGIES No Known Allergies  PAST MEDICAL  HISTORY Past Medical History:  Diagnosis Date   Cataract    NS OU   Diabetes mellitus without complication (HCC)    Diabetic retinopathy ([place])    NPDR OU   Hypertension    Hypertensive retinopathy    OU   Past Surgical History:  Procedure Laterality Date   EYE SURGERY  2015   OS/OD   HYSTERECTOMY ABDOMINAL WITH SALPINGECTOMY     FAMILY HISTORY Family History  Problem Relation Age of Onset   Hypertension Mother    Diabetes Maternal Aunt    Breast cancer Other    SOCIAL HISTORY Social History   Tobacco Use   Smoking status:  Never   Smokeless tobacco: Never  Vaping Use   Vaping Use: Never used  Substance Use Topics   Alcohol use: No   Drug use: No       OPHTHALMIC EXAM: Base Eye Exam     Visual Acuity (Snellen - Linear)       Right Left   Dist [place] 20/70 +2 20/40 -2   Dist ph [place] 20/30 20/20 -1         Tonometry (Tonopen, 8:40 AM)       Right Left   Pressure 15 15         Pupils       Dark Light Shape React APD   Right 3 2 Round Minimal None   Left 3 2 Round Minimal None         Visual Fields (Counting fingers)       Left Right    Full Full         Extraocular Movement       Right Left    Full, Ortho Full, Ortho         Neuro/Psych     Oriented x3: Yes   Mood/Affect: Normal         Dilation     Both eyes: 1.0% Mydriacyl, 2.5% Phenylephrine @ 8:40 AM           Slit Lamp and Fundus Exam     Slit Lamp Exam       Right Left   Lids/Lashes Dermatochalasis - upper lid, Meibomian gland dysfunction Dermatochalasis - upper lid, Meibomian gland dysfunction   Conjunctiva/Sclera White and quiet White and quiet   Cornea trace Punctate epithelial erosions, Arcus Arcus, trace PEE   Anterior Chamber Deep and quiet Deep and quiet   Iris Round and dilated, No NVI Round and dilated, No NVI   Lens 2-3+ Nuclear sclerosis with early brunescence, 2-3+ Cortical cataract, +Vacuoles, trace Posterior subcapsular cataract 2-3+ Nuclear sclerosis with brunescence, 2-3+ Cortical cataract, +Vacuoles   Anterior Vitreous Vitreous syneresis Vitreous syneresis         Fundus Exam       Right Left   Disc No NVD, Pink and Sharp, Compact, PPP/PPA trace pallor, sharp rim, Compact, mild PPP   C/D Ratio 0.3 0.3   Macula Flat, good foveal reflex, Retinal pigment epithelial mottling, scattered MA greatest temporal macula, mild focal laser scars temporal macula Flat, blunted foveal reflex, RPE mottling, peristent focal exudates and MA's temporal mac -- slightly improved, +light focal laser  scars   Vessels attenuated, Tortuous, mild copper wiring attenuated, Tortuous   Periphery Attached, Rare MA / DBH greatest temporal periphery Attached, scattered MA, scattered RPE changes, good segmental PRP temporal periphery            IMAGING AND PROCEDURES  Imaging and Procedures for  09/26/17  OCT, Retina - OU - Both Eyes       Right Eye Quality was good. Central Foveal Thickness: 230. Progression has been stable. Findings include normal foveal contour, no IRF, no SRF, vitreomacular adhesion (Trace non central cystic changes temporal macula and superior to disc - improved; seen best on widefield, partial PVD).   Left Eye Quality was good. Central Foveal Thickness: 223. Progression has been stable. Findings include normal foveal contour, no IRF, no SRF, intraretinal hyper-reflective material, vitreomacular adhesion (stable improvement in trace cystic changes/IRF and IRHM IT macula).   Notes Images taken, stored on drive  Diagnosis / Impression:  NFP OU; No frank DME OD- Trace non central cystic changes temporal macula and superior to disc - persistent, seen best on widefield; partial PVD OS- stable improvement in trace cystic changes/IRF and IRHM IT macula  Clinical management:  See below  Abbreviations: NFP - Normal foveal profile. CME - cystoid macular edema. PED - pigment epithelial detachment. IRF - intraretinal fluid. SRF - subretinal fluid. EZ - ellipsoid zone. ERM - epiretinal membrane. ORA - outer retinal atrophy. ORT - outer retinal tubulation. SRHM - subretinal hyper-reflective material       Fluorescein Angiography Optos (Transit OD)       Right Eye Progression has been stable. Early phase findings include staining, microaneurysm, vascular perfusion defect. Mid/Late phase findings include leakage, staining, microaneurysm, vascular perfusion defect (Mild peripheral vascular perfusion defect temporally, mild perivascular leakage temporal periphery, no NV).    Left Eye Progression has been stable. Early phase findings include staining, microaneurysm, vascular perfusion defect. Mid/Late phase findings include leakage, staining, microaneurysm, vascular perfusion defect (Vascular nonperfusion with mild perivascular leakage temporal periphery -- improved from prior, s/p segmental PRP, no NV).   Notes **Images stored on drive**  Impression: Moderate NPDR OU OD: Mild peripheral vascular perfusion defects temporally, mild perivascular leakage temporal periphery, no NV OS: Vascular nonperfusion with mild perivascular leakage temporal periphery -- improved from prior, s/p segmental PRP, no NV           ASSESSMENT/PLAN:    ICD-10-CM   1. Moderate nonproliferative diabetic retinopathy of both eyes with macular edema associated with type 2 diabetes mellitus (HCC)  O17.7116 OCT, Retina - OU - Both Eyes    Fluorescein Angiography Optos (Transit OD)    2. Essential hypertension  I10     3. Hypertensive retinopathy of both eyes  H35.033 Fluorescein Angiography Optos (Transit OD)    4. Combined forms of age-related cataract of both eyes  H25.813      1. Moderate non-proliferative diabetic retinopathy, both eyes -- stable  - no NV noted on exam or prior FA  - S/P focal laser OS (03.20.19)  - S/P focal laser OD (04.04.19)  - s/p segmental PRP OS temporal periphery (12.21.22) for vascular nonperfusion  - non central diabetic macular edema OU -- essentially resolved now  - FA (11.23.22) shows OD: scattered MA w/o significant leakage; OS: Vascular nonperfusion w/ perivascular leakage temporal periphery -- s/p segmental PRP (12.21.22) as above  - repeat FA (06.09.23) shows OD: Mild peripheral vascular perfusion defect temporally, mild perivascular leakage temporal periphery, no NV; OS: Vascular nonperfusion with mild perivascular leakage temporal periphery -- improved from prior, s/p segmental PRP, no NV  - BCVA remains good / stable (OD 20/30, OS  20/20) - no retinal or ophthalmic interventions indicated or recommended  - f/u 4 months -- DFE/OCT  2,3. Hypertensive retinopathy OU  - discussed importance of tight  BP control  - monitor   4. Mixed cataract OU OU  - The symptoms of cataract, surgical options, and treatments and risks were discussed with patient.  - discussed diagnosis and progression  - under the management of expert surgeon, Dr. Shirleen Schirmer   - clear from a retina standpoint to proceed with cataract surgery when both patient and surgeon are ready   Ophthalmic Meds Ordered this visit:  No orders of the defined types were placed in this encounter.    Return for f/u 6-9 months, NPDR OU, DFE, OCT.  There are no Patient Instructions on file for this visit.   This document serves as a record of services personally performed by Gardiner Sleeper, MD, PhD. It was created on their behalf by Leonie Douglas, an ophthalmic technician. The creation of this record is the provider's dictation and/or activities during the visit.    Electronically signed by: Leonie Douglas COA, 12/02/21  2:14 AM  This document serves as a record of services personally performed by Gardiner Sleeper, MD, PhD. It was created on their behalf by San Jetty. Owens Shark, OA an ophthalmic technician. The creation of this record is the provider's dictation and/or activities during the visit.    Electronically signed by: San Jetty. Owens Shark, [place] 06.09.2023 2:14 AM  Gardiner Sleeper, M.D., Ph.D. Diseases & Surgery of the Retina and Vitreous [facility]  I have reviewed the above documentation for accuracy and completeness, and I agree with the above. Gardiner Sleeper, M.D., Ph.D. 12/02/21 2:18 AM  Abbreviations: M myopia (nearsighted); A astigmatism; H hyperopia (farsighted); P presbyopia; Mrx spectacle prescription;  CTL contact lenses; OD right eye; OS left eye; OU both eyes  XT exotropia; ET esotropia; PEK punctate epithelial keratitis; PEE punctate  epithelial erosions; DES dry eye syndrome; MGD meibomian gland dysfunction; ATs artificial tears; PFAT's preservative free artificial tears; [place] nuclear sclerotic cataract; PSC posterior subcapsular cataract; ERM epi-retinal membrane; PVD posterior vitreous detachment; RD retinal detachment; DM diabetes mellitus; DR diabetic retinopathy; NPDR non-proliferative diabetic retinopathy; PDR proliferative diabetic retinopathy; CSME clinically significant macular edema; DME diabetic macular edema; dbh dot blot hemorrhages; CWS cotton wool spot; POAG primary open angle glaucoma; C/D cup-to-disc ratio; HVF humphrey visual field; GVF goldmann visual field; OCT optical coherence tomography; IOP intraocular pressure; BRVO Branch retinal vein occlusion; CRVO central retinal vein occlusion; CRAO central retinal artery occlusion; BRAO branch retinal artery occlusion; RT retinal tear; SB scleral buckle; PPV pars plana vitrectomy; VH Vitreous hemorrhage; PRP panretinal laser photocoagulation; IVK intravitreal kenalog; VMT vitreomacular traction; MH Macular hole;  NVD neovascularization of the disc; NVE neovascularization elsewhere; AREDS age related eye disease study; ARMD age related macular degeneration; POAG primary open angle glaucoma; EBMD epithelial/anterior basement membrane dystrophy; ACIOL anterior chamber intraocular lens; IOL intraocular lens; PCIOL posterior chamber intraocular lens; Phaco/IOL phacoemulsification with intraocular lens placement; [place] photorefractive keratectomy; LASIK laser assisted in situ keratomileusis; HTN hypertension; DM diabetes mellitus; COPD chronic obstructive pulmonary disease

## 2021-12-01 ENCOUNTER — Encounter (INDEPENDENT_AMBULATORY_CARE_PROVIDER_SITE_OTHER): Payer: Self-pay | Admitting: Ophthalmology

## 2021-12-01 ENCOUNTER — Ambulatory Visit (INDEPENDENT_AMBULATORY_CARE_PROVIDER_SITE_OTHER): Payer: HMO | Admitting: Ophthalmology

## 2021-12-01 DIAGNOSIS — H35033 Hypertensive retinopathy, bilateral: Secondary | ICD-10-CM

## 2021-12-01 DIAGNOSIS — I10 Essential (primary) hypertension: Secondary | ICD-10-CM

## 2021-12-01 DIAGNOSIS — E113313 Type 2 diabetes mellitus with moderate nonproliferative diabetic retinopathy with macular edema, bilateral: Secondary | ICD-10-CM

## 2021-12-01 DIAGNOSIS — H25813 Combined forms of age-related cataract, bilateral: Secondary | ICD-10-CM | POA: Diagnosis not present

## 2021-12-02 ENCOUNTER — Encounter (INDEPENDENT_AMBULATORY_CARE_PROVIDER_SITE_OTHER): Payer: Self-pay | Admitting: Ophthalmology

## 2021-12-08 ENCOUNTER — Ambulatory Visit: Payer: HMO

## 2021-12-15 ENCOUNTER — Ambulatory Visit
Admission: RE | Admit: 2021-12-15 | Discharge: 2021-12-15 | Disposition: A | Discharge status: Home / Self Care | Payer: HMO | Source: Ambulatory Visit | Attending: Obstetrics and Gynecology | Admitting: Obstetrics and Gynecology

## 2021-12-15 DIAGNOSIS — Z1231 Encounter for screening mammogram for malignant neoplasm of breast: Secondary | ICD-10-CM

## 2021-12-29 DIAGNOSIS — E113393 Type 2 diabetes mellitus with moderate nonproliferative diabetic retinopathy without macular edema, bilateral: Secondary | ICD-10-CM | POA: Diagnosis not present

## 2021-12-29 DIAGNOSIS — H2513 Age-related nuclear cataract, bilateral: Secondary | ICD-10-CM | POA: Diagnosis not present

## 2022-01-05 DIAGNOSIS — E1165 Type 2 diabetes mellitus with hyperglycemia: Secondary | ICD-10-CM | POA: Diagnosis not present

## 2022-01-05 DIAGNOSIS — E669 Obesity, unspecified: Secondary | ICD-10-CM | POA: Diagnosis not present

## 2022-01-05 DIAGNOSIS — E7800 Pure hypercholesterolemia, unspecified: Secondary | ICD-10-CM | POA: Diagnosis not present

## 2022-01-05 DIAGNOSIS — N8110 Cystocele, unspecified: Secondary | ICD-10-CM | POA: Diagnosis not present

## 2022-01-05 DIAGNOSIS — I10 Essential (primary) hypertension: Secondary | ICD-10-CM | POA: Diagnosis not present

## 2022-03-08 DIAGNOSIS — N8111 Cystocele, midline: Secondary | ICD-10-CM | POA: Diagnosis not present

## 2022-03-08 DIAGNOSIS — R350 Frequency of micturition: Secondary | ICD-10-CM | POA: Diagnosis not present

## 2022-03-08 DIAGNOSIS — R351 Nocturia: Secondary | ICD-10-CM | POA: Diagnosis not present

## 2022-04-05 IMAGING — MG MM DIGITAL SCREENING BILAT W/ TOMO AND CAD
6 of 10 series · 6 of 30 positions shown · non-contrast
Comparison: Previous exam(s).

CLINICAL DATA: Screening.

EXAM:
DIGITAL SCREENING BILATERAL MAMMOGRAM WITH TOMOSYNTHESIS AND CAD
TECHNIQUE: Bilateral screening digital craniocaudal and mediolateral oblique
mammograms were obtained. Bilateral screening digital breast
tomosynthesis was performed. The images were evaluated with
computer-aided detection.

[L CC synth-2D]
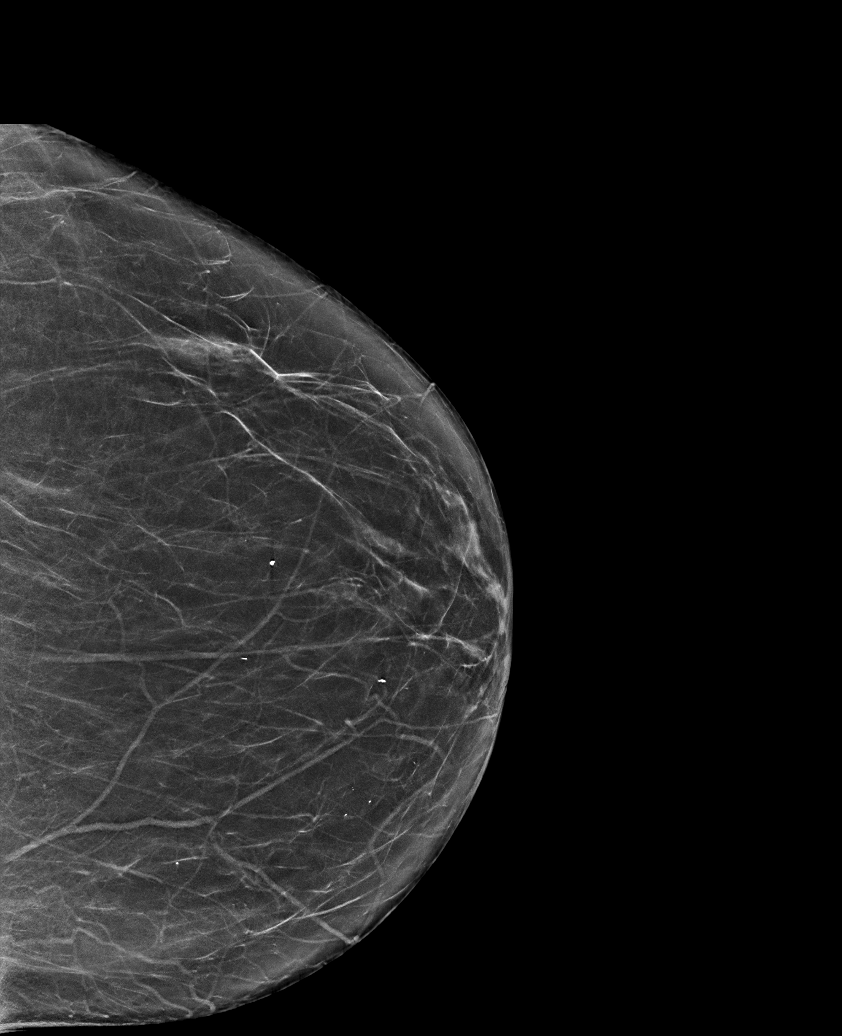

[L CV synth-2D]
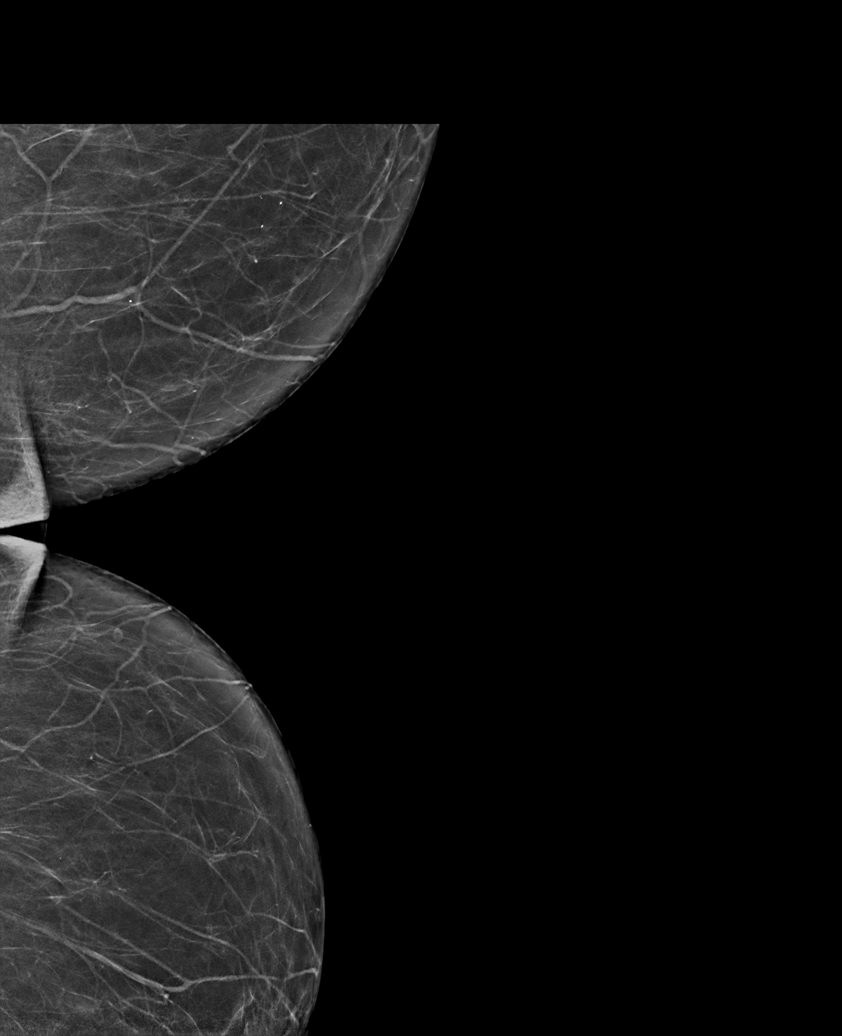

[R MLO synth-2D]
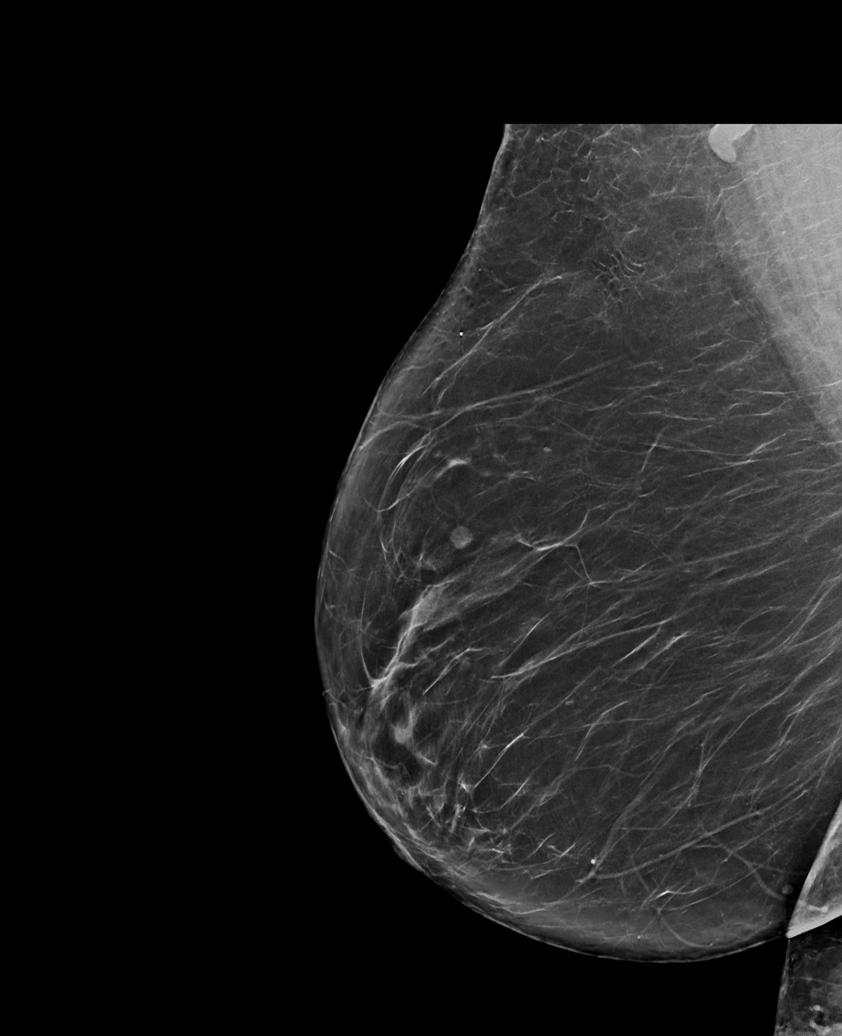

[L MLO synth-2D]
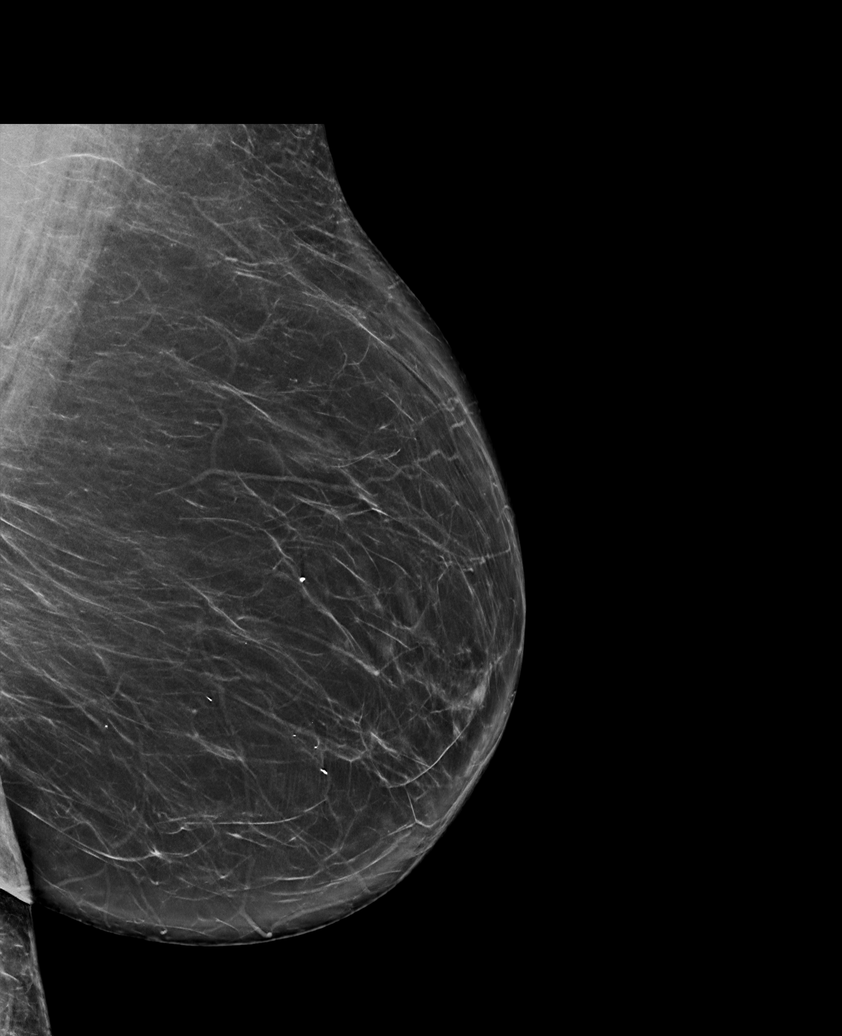

[R CC synth-2D]
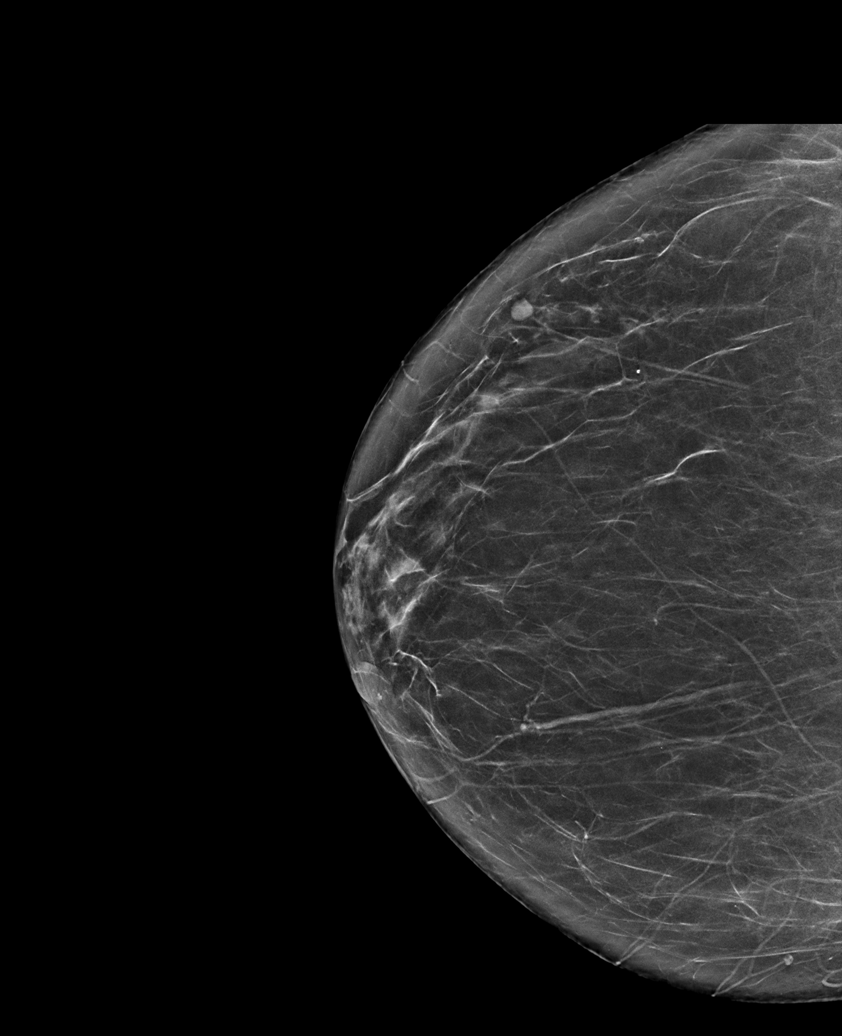

[L CC tomo · tomo slice 35/68.0]
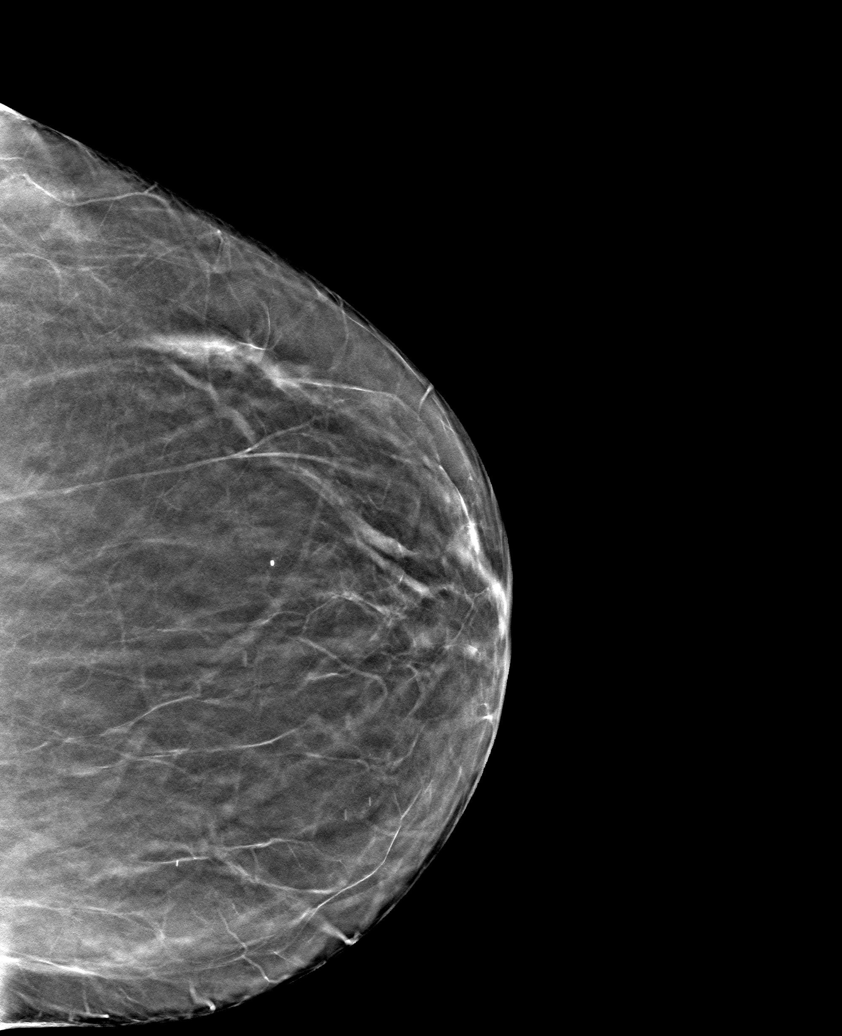

[6 of 30 positions shown; findings below may reference images not displayed]

ACR Breast Density Category b: There are scattered areas of
fibroglandular density.
FINDINGS: There are no findings suspicious for malignancy. The images were
evaluated with computer-aided detection.
IMPRESSION: No mammographic evidence of malignancy. A result letter of this
screening mammogram will be mailed directly to the patient.

RECOMMENDATION:
Screening mammogram in one year. (Code:[id number])

BI-RADS CATEGORY  1: Negative.

## 2022-06-12 ENCOUNTER — Other Ambulatory Visit (HOSPITAL_COMMUNITY): Payer: Self-pay

## 2022-06-13 ENCOUNTER — Other Ambulatory Visit: Payer: Self-pay

## 2022-06-13 ENCOUNTER — Other Ambulatory Visit (HOSPITAL_COMMUNITY): Payer: Self-pay

## 2022-06-13 MED ORDER — INSULIN GLARGINE 100 UNIT/ML ~~LOC~~ SOLN
20.0000 [IU] | Freq: Every day | SUBCUTANEOUS | 6 refills | Status: DC
  Filled 2022-06-13: qty 30, 84d supply, fill #0

## 2022-06-20 ENCOUNTER — Other Ambulatory Visit (HOSPITAL_COMMUNITY): Payer: Self-pay

## 2022-07-10 DIAGNOSIS — E7800 Pure hypercholesterolemia, unspecified: Secondary | ICD-10-CM | POA: Diagnosis not present

## 2022-07-10 DIAGNOSIS — E1165 Type 2 diabetes mellitus with hyperglycemia: Secondary | ICD-10-CM | POA: Diagnosis not present

## 2022-07-10 DIAGNOSIS — E669 Obesity, unspecified: Secondary | ICD-10-CM | POA: Diagnosis not present

## 2022-07-10 DIAGNOSIS — I10 Essential (primary) hypertension: Secondary | ICD-10-CM | POA: Diagnosis not present

## 2022-07-10 DIAGNOSIS — N8110 Cystocele, unspecified: Secondary | ICD-10-CM | POA: Diagnosis not present

## 2022-07-24 DIAGNOSIS — Z0001 Encounter for general adult medical examination with abnormal findings: Secondary | ICD-10-CM | POA: Diagnosis not present

## 2022-07-24 DIAGNOSIS — E1136 Type 2 diabetes mellitus with diabetic cataract: Secondary | ICD-10-CM | POA: Diagnosis not present

## 2022-07-24 DIAGNOSIS — Z79899 Other long term (current) drug therapy: Secondary | ICD-10-CM | POA: Diagnosis not present

## 2022-07-24 DIAGNOSIS — E113313 Type 2 diabetes mellitus with moderate nonproliferative diabetic retinopathy with macular edema, bilateral: Secondary | ICD-10-CM | POA: Diagnosis not present

## 2022-07-24 DIAGNOSIS — Z794 Long term (current) use of insulin: Secondary | ICD-10-CM | POA: Diagnosis not present

## 2022-07-24 DIAGNOSIS — E7800 Pure hypercholesterolemia, unspecified: Secondary | ICD-10-CM | POA: Diagnosis not present

## 2022-07-24 DIAGNOSIS — E1165 Type 2 diabetes mellitus with hyperglycemia: Secondary | ICD-10-CM | POA: Diagnosis not present

## 2022-07-26 NOTE — Progress Notes (Shared)
[facility] Note  08/01/2022     CHIEF COMPLAINT Patient presents for No chief complaint on file.   HISTORY OF PRESENT ILLNESS: Kimberly Hammond is a 72 y.o. female who presents to the clinic today for:      Referring physician:  Lujean Amel, MD [address] [employer],  [place] [address]  HISTORICAL INFORMATION:   Selected notes from the MEDICAL RECORD NUMBER Referred form Dr. Shirleen Schirmer for concern of macular edema OS;  Ocular Hx- NPDR OU; cataract OU;  PMH- Type 2 DM; HTN   CURRENT MEDICATIONS: No current outpatient medications on file. (Ophthalmic Drugs)   No current facility-administered medications for this visit. (Ophthalmic Drugs)   Current Outpatient Medications (Other)  Medication Sig   aspirin 81 MG chewable tablet Chew 81 mg by mouth daily.   atorvastatin (LIPITOR) 10 MG tablet Take 10 mg by mouth daily.   benzonatate (TESSALON) 100 MG capsule Take 1 capsule (100 mg total) by mouth 3 (three) times daily as needed for cough. (Patient not taking: Reported on 05/17/2021)   ferrous sulfate 324 (65 Fe) MG TBEC Take 1 tablet by mouth daily.   glipiZIDE (GLUCOTROL XL) 5 MG 24 hr tablet TAKE 1 TABLET BY MOUTH TWICE DAILY FOR 90 DAYS   glipiZIDE (GLUCOTROL) 5 MG tablet Take 5 mg 2 (two) times daily before a meal by mouth.    insulin glargine (LANTUS) 100 UNIT/ML injection Inject 24 Units into the skin at bedtime.   insulin glargine (LANTUS) 100 UNIT/ML injection Inject 0.2 mLs (20 Units total) into the skin daily. (open vial expires 28 days after opening)   lisinopril-hydrochlorothiazide (PRINZIDE,ZESTORETIC) 20-25 MG tablet Take 1 tablet by mouth daily.   metFORMIN (GLUCOPHAGE) 1000 MG tablet TAKE 1 TABLET BY MOUTH TWICE DAILY WITH A MEAL   metFORMIN (GLUCOPHAGE) 500 MG tablet Take 1,000 mg 2 (two) times daily with a meal by mouth.  (Patient not taking: Reported on 05/17/2021)   metoprolol succinate (TOPROL-XL) 25 MG 24 hr  tablet Take 25 mg by mouth daily.   mupirocin ointment (BACTROBAN) 2 % Apply 1 application topically 2 (two) times daily.   naproxen (NAPROSYN) 500 MG tablet Take 500 mg by mouth 2 (two) times daily with a meal.   Nutritional Supplements (VITAMIN D MAINTENANCE PO) Take 1 capsule by mouth daily.   ONETOUCH VERIO test strip as directed.   polyethylene glycol-electrolytes (NULYTELY/GOLYTELY) 420 g solution See admin instructions. (Patient not taking: Reported on 10/24/2020)   Potassium 99 MG TABS Take 1 tablet by mouth daily.   RELION INSULIN SYR 0.5ML/31G 31G X 5/16" 0.5 ML MISC as directed. (Patient not taking: Reported on 02/04/2020)   No current facility-administered medications for this visit. (Other)   REVIEW OF SYSTEMS:   ALLERGIES No Known Allergies  PAST MEDICAL HISTORY Past Medical History:  Diagnosis Date   Cataract    NS OU   Diabetes mellitus without complication ([place])    Diabetic retinopathy ([place])    NPDR OU   Hypertension    Hypertensive retinopathy    OU   Past Surgical History:  Procedure Laterality Date   EYE SURGERY  2015   OS/OD   HYSTERECTOMY ABDOMINAL WITH SALPINGECTOMY     FAMILY HISTORY Family History  Problem Relation Age of Onset   Hypertension Mother    Diabetes Maternal Aunt    Breast cancer Other    SOCIAL HISTORY Social History   Tobacco Use  Smoking status: Never   Smokeless tobacco: Never  Vaping Use   Vaping Use: Never used  Substance Use Topics   Alcohol use: No   Drug use: No       OPHTHALMIC EXAM: Not recorded     IMAGING AND PROCEDURES  Imaging and Procedures for 09/26/17          ASSESSMENT/PLAN:  No diagnosis found.  1. Moderate non-proliferative diabetic retinopathy, both eyes -- stable  - no NV noted on exam or prior FA  - S/P focal laser OS (03.20.19)  - S/P focal laser OD (04.04.19)  - s/p segmental PRP OS temporal periphery (12.21.22) for vascular nonperfusion  - non central diabetic macular edema OU  -- essentially resolved now  - FA (11.23.22) shows OD: scattered MA w/o significant leakage; OS: Vascular nonperfusion w/ perivascular leakage temporal periphery -- s/p segmental PRP (12.21.22) as above  - repeat FA (06.09.23) shows OD: Mild peripheral vascular perfusion defect temporally, mild perivascular leakage temporal periphery, no NV; OS: Vascular nonperfusion with mild perivascular leakage temporal periphery -- improved from prior, s/p segmental PRP, no NV  - BCVA remains good / stable (OD 20/30, OS 20/20) - no retinal or ophthalmic interventions indicated or recommended  - f/u 4 months -- DFE/OCT  2,3. Hypertensive retinopathy OU  - discussed importance of tight BP control  - monitor   4. Mixed cataract OU OU  - The symptoms of cataract, surgical options, and treatments and risks were discussed with patient.  - discussed diagnosis and progression  - under the management of expert surgeon, Dr. Shirleen Schirmer   - clear from a retina standpoint to proceed with cataract surgery when both patient and surgeon are ready   Ophthalmic Meds Ordered this visit:  No orders of the defined types were placed in this encounter.    No follow-ups on file.  There are no Patient Instructions on file for this visit.   This document serves as a record of services personally performed by Gardiner Sleeper, MD, PhD. It was created on their behalf by Orvan Falconer, an ophthalmic technician. The creation of this record is the provider's dictation and/or activities during the visit.    Electronically signed by: Orvan Falconer, OA, 07/26/22  12:10 PM   Gardiner Sleeper, M.D., Ph.D. Diseases & Surgery of the Retina and Vitreous [facility]: M myopia (nearsighted); A astigmatism; H hyperopia (farsighted); P presbyopia; Mrx spectacle prescription;  CTL contact lenses; OD right eye; OS left eye; OU both eyes  XT exotropia; ET esotropia; PEK punctate epithelial  keratitis; PEE punctate epithelial erosions; DES dry eye syndrome; MGD meibomian gland dysfunction; ATs artificial tears; PFAT's preservative free artificial tears; [place] nuclear sclerotic cataract; PSC posterior subcapsular cataract; ERM epi-retinal membrane; PVD posterior vitreous detachment; RD retinal detachment; DM diabetes mellitus; DR diabetic retinopathy; NPDR non-proliferative diabetic retinopathy; PDR proliferative diabetic retinopathy; CSME clinically significant macular edema; DME diabetic macular edema; dbh dot blot hemorrhages; CWS cotton wool spot; POAG primary open angle glaucoma; C/D cup-to-disc ratio; HVF humphrey visual field; GVF goldmann visual field; OCT optical coherence tomography; IOP intraocular pressure; BRVO Branch retinal vein occlusion; CRVO central retinal vein occlusion; CRAO central retinal artery occlusion; BRAO branch retinal artery occlusion; RT retinal tear; SB scleral buckle; PPV pars plana vitrectomy; VH Vitreous hemorrhage; PRP panretinal laser photocoagulation; IVK intravitreal kenalog; VMT vitreomacular traction; MH Macular hole;  NVD neovascularization of the disc; NVE neovascularization elsewhere; AREDS age related eye  disease study; ARMD age related macular degeneration; POAG primary open angle glaucoma; EBMD epithelial/anterior basement membrane dystrophy; ACIOL anterior chamber intraocular lens; IOL intraocular lens; PCIOL posterior chamber intraocular lens; Phaco/IOL phacoemulsification with intraocular lens placement; [place] photorefractive keratectomy; LASIK laser assisted in situ keratomileusis; HTN hypertension; DM diabetes mellitus; COPD chronic obstructive pulmonary disease

## 2022-08-01 ENCOUNTER — Encounter (INDEPENDENT_AMBULATORY_CARE_PROVIDER_SITE_OTHER): Payer: HMO | Admitting: Ophthalmology

## 2022-08-01 DIAGNOSIS — I10 Essential (primary) hypertension: Secondary | ICD-10-CM

## 2022-08-01 DIAGNOSIS — H25813 Combined forms of age-related cataract, bilateral: Secondary | ICD-10-CM

## 2022-08-01 DIAGNOSIS — E113313 Type 2 diabetes mellitus with moderate nonproliferative diabetic retinopathy with macular edema, bilateral: Secondary | ICD-10-CM

## 2022-08-01 DIAGNOSIS — H35033 Hypertensive retinopathy, bilateral: Secondary | ICD-10-CM

## 2022-09-11 ENCOUNTER — Encounter (INDEPENDENT_AMBULATORY_CARE_PROVIDER_SITE_OTHER): Payer: HMO | Admitting: Ophthalmology

## 2022-09-27 NOTE — Progress Notes (Signed)
[facility] - Clinic Note  10/03/2022     CHIEF COMPLAINT Patient presents for Retina Follow Up   HISTORY OF PRESENT ILLNESS: Kimberly Hammond is a 72 y.o. female who presents to the clinic today for:   HPI     Retina Follow Up   Patient presents with  Diabetic Retinopathy.  In both eyes.  This started 9 months ago.  I, the attending physician,  performed the HPI with the patient and updated documentation appropriately.        Comments   Patient here for 9 months retina follow up for NPDR OU. Patient states vision doing ok. No eye pain.       Last edited by Rennis Chris, MD on 10/03/2022  8:47 AM.    Pt states she is not having any problems with her vision, her A1c was 7.5 in February, she has not had any medication changes recently, she saw Dr. Dione Booze last summer, he said she was not ready for cataract sx yet  Referring physician:  Darrow Bussing, MD [address] [place],  [place] [address]  HISTORICAL INFORMATION:   Selected notes from the MEDICAL RECORD NUMBER Referred form Dr. Zetta Bills for concern of macular edema OS;  Ocular Hx- NPDR OU; cataract OU;  PMH- Type 2 DM; HTN   CURRENT MEDICATIONS: No current outpatient medications on file. (Ophthalmic Drugs)   No current facility-administered medications for this visit. (Ophthalmic Drugs)   Current Outpatient Medications (Other)  Medication Sig   aspirin 81 MG chewable tablet Chew 81 mg by mouth daily.   atorvastatin (LIPITOR) 10 MG tablet Take 10 mg by mouth daily.   ferrous sulfate 324 (65 Fe) MG TBEC Take 1 tablet by mouth daily.   glipiZIDE (GLUCOTROL XL) 5 MG 24 hr tablet TAKE 1 TABLET BY MOUTH TWICE DAILY FOR 90 DAYS   glipiZIDE (GLUCOTROL) 5 MG tablet Take 5 mg 2 (two) times daily before a meal by mouth.    insulin glargine (LANTUS) 100 UNIT/ML injection Inject 24 Units into the skin at bedtime.   insulin glargine (LANTUS) 100 UNIT/ML injection Inject 0.2 mLs (20 Units  total) into the skin daily. (open vial expires 28 days after opening)   lisinopril-hydrochlorothiazide (PRINZIDE,ZESTORETIC) 20-25 MG tablet Take 1 tablet by mouth daily.   metFORMIN (GLUCOPHAGE) 1000 MG tablet TAKE 1 TABLET BY MOUTH TWICE DAILY WITH A MEAL   metoprolol succinate (TOPROL-XL) 25 MG 24 hr tablet Take 25 mg by mouth daily.   mupirocin ointment (BACTROBAN) 2 % Apply 1 application topically 2 (two) times daily.   naproxen (NAPROSYN) 500 MG tablet Take 500 mg by mouth 2 (two) times daily with a meal.   Nutritional Supplements (VITAMIN D MAINTENANCE PO) Take 1 capsule by mouth daily.   ONETOUCH VERIO test strip as directed.   Potassium 99 MG TABS Take 1 tablet by mouth daily.   benzonatate (TESSALON) 100 MG capsule Take 1 capsule (100 mg total) by mouth 3 (three) times daily as needed for cough. (Patient not taking: Reported on 05/17/2021)   metFORMIN (GLUCOPHAGE) 500 MG tablet Take 1,000 mg 2 (two) times daily with a meal by mouth.  (Patient not taking: Reported on 05/17/2021)   polyethylene glycol-electrolytes (NULYTELY/GOLYTELY) 420 g solution See admin instructions. (Patient not taking: Reported on 10/24/2020)   RELION INSULIN SYR 0.5ML/31G 31G X 5/16" 0.5 ML MISC as directed. (Patient not taking: Reported on 02/04/2020)   No current facility-administered medications for this  visit. (Other)   REVIEW OF SYSTEMS: ROS   Positive for: Endocrine, Eyes Negative for: Constitutional, Gastrointestinal, Neurological, Skin, Genitourinary, Musculoskeletal, HENT, Cardiovascular, Respiratory, Psychiatric, Allergic/Imm, Heme/Lymph Last edited by Laddie Aquas, COA on 10/03/2022  7:56 AM.     ALLERGIES No Known Allergies  PAST MEDICAL HISTORY Past Medical History:  Diagnosis Date   Cataract    NS OU   Diabetes mellitus without complication    Diabetic retinopathy    NPDR OU   Hypertension    Hypertensive retinopathy    OU   Past Surgical History:  Procedure Laterality Date   EYE  SURGERY  2015   OS/OD   HYSTERECTOMY ABDOMINAL WITH SALPINGECTOMY     FAMILY HISTORY Family History  Problem Relation Age of Onset   Hypertension Mother    Diabetes Maternal Aunt    Breast cancer Other    SOCIAL HISTORY Social History   Tobacco Use   Smoking status: Never   Smokeless tobacco: Never  Vaping Use   Vaping Use: Never used  Substance Use Topics   Alcohol use: No   Drug use: No       OPHTHALMIC EXAM: Base Eye Exam     Visual Acuity (Snellen - Linear)       Right Left   Dist cc 20/50 -2 20/40 -1   Dist ph cc 20/25 20/25 -2    Correction: Glasses         Tonometry (Tonopen, 7:53 AM)       Right Left   Pressure 15 14         Pupils       Dark Light Shape React APD   Right 3 2 Round Minimal None   Left 3 2 Round Minimal None         Visual Fields (Counting fingers)       Left Right    Full Full         Extraocular Movement       Right Left    Full, Ortho Full, Ortho         Neuro/Psych     Oriented x3: Yes   Mood/Affect: Normal         Dilation     Both eyes: 1.0% Mydriacyl, 2.5% Phenylephrine @ 7:53 AM           Slit Lamp and Fundus Exam     Slit Lamp Exam       Right Left   Lids/Lashes Dermatochalasis - upper lid, Meibomian gland dysfunction Dermatochalasis - upper lid, Meibomian gland dysfunction   Conjunctiva/Sclera mild melanosis mild melanosis   Cornea Arcus, trace tear film debris Arcus, 1+PEE   Anterior Chamber deep and clear deep and clear   Iris Round and dilated, No NVI Round and dilated, No NVI   Lens 2-3+ Nuclear sclerosis with early brunescence, 2-3+ Cortical cataract, +Vacuoles, trace Posterior subcapsular cataract 2-3+ Nuclear sclerosis with brunescence, 2-3+ Cortical cataract, +Vacuoles   Anterior Vitreous mild syneresis mild syneresis         Fundus Exam       Right Left   Disc No NVD, Pink and Sharp, Compact, PPP/PPA trace pallor, sharp rim, Compact, mild PPP   C/D Ratio 0.3 0.3    Macula Flat, good foveal reflex, Retinal pigment epithelial mottling, rare MA greatest temporal macula, mild focal laser scars temporal macula Flat, blunted foveal reflex, RPE mottling, peristent focal exudates and MA's temporal mac -- slightly improved, +light focal laser scars  Vessels attenuated, Tortuous, mild copper wiring attenuated, mild tortuosity   Periphery Attached, Rare MA / DBH greatest temporal periphery Attached, scattered MA, scattered RPE changes, good segmental PRP temporal periphery           Refraction     Wearing Rx       Sphere Cylinder Axis Add   Right -1.00 +0.50 088 +2.75   Left -2.00 +0.50 161 +2.75            IMAGING AND PROCEDURES  Imaging and Procedures for 09/26/17  OCT, Retina - OU - Both Eyes       Right Eye Quality was good. Central Foveal Thickness: 221. Progression has been stable. Findings include normal foveal contour, no IRF, no SRF, vitreomacular adhesion (Trace non central cystic changes temporal macula and superior to disc - improved; seen best on widefield, partial PVD).   Left Eye Quality was good. Central Foveal Thickness: 220. Progression has been stable. Findings include normal foveal contour, no IRF, no SRF, intraretinal hyper-reflective material, vitreomacular adhesion (stable improvement in trace cystic changes/IRF and IRHM IT macula).   Notes Images taken, stored on drive  Diagnosis / Impression:  NFP OU; No frank DME OD- Trace non central cystic changes temporal macula and superior to disc - persistent, seen best on widefield; partial PVD OS- stable improvement in trace cystic changes/IRF and IRHM IT macula  Clinical management:  See below  Abbreviations: NFP - Normal foveal profile. CME - cystoid macular edema. PED - pigment epithelial detachment. IRF - intraretinal fluid. SRF - subretinal fluid. EZ - ellipsoid zone. ERM - epiretinal membrane. ORA - outer retinal atrophy. ORT - outer retinal tubulation. SRHM -  subretinal hyper-reflective material              ASSESSMENT/PLAN:    ICD-10-CM   1. Moderate nonproliferative diabetic retinopathy of both eyes with macular edema associated with type 2 diabetes mellitus  E11.3313 OCT, Retina - OU - Both Eyes    2. Essential hypertension  I10     3. Hypertensive retinopathy of both eyes  H35.033     4. Combined forms of age-related cataract of both eyes  H25.813      1. Moderate non-proliferative diabetic retinopathy, both eyes -- stable  - A1c: 7.5 on 02.07.24  - no NV noted on exam or prior FA  - S/P focal laser OS (03.20.19)  - S/P focal laser OD (04.04.19)  - s/p segmental PRP OS temporal periphery (12.21.22) for vascular nonperfusion  - non central diabetic macular edema OU -- essentially resolved now  - FA (11.23.22) shows OD: scattered MA w/o significant leakage; OS: Vascular nonperfusion w/ perivascular leakage temporal periphery -- s/p segmental PRP (12.21.22) as above  - repeat FA (06.09.23) shows OD: Mild peripheral vascular perfusion defect temporally, mild perivascular leakage temporal periphery, no NV; OS: Vascular nonperfusion with mild perivascular leakage temporal periphery -- improved from prior, s/p segmental PRP, no NV  - BCVA remains good / stable (20/25 OU) - no retinal or ophthalmic interventions indicated or recommended  - f/u 1 year -- DFE/OCT  2,3. Hypertensive retinopathy OU  - discussed importance of tight BP control  - monitor   4. Mixed cataract OU OU  - The symptoms of cataract, surgical options, and treatments and risks were discussed with patient.  - discussed diagnosis and progression  - under the management of expert surgeon, Dr. Zetta Bills   - clear from a retina standpoint to proceed with cataract surgery when  both patient and surgeon are ready   Ophthalmic Meds Ordered this visit:  No orders of the defined types were placed in this encounter.    Return in about 1 year (around 10/03/2023) for f/u NPDR  OU, DFE, OCT.  There are no Patient Instructions on file for this visit.   This document serves as a record of services personally performed by Karie Chimera, MD, PhD. It was created on their behalf by De Blanch, an ophthalmic technician. The creation of this record is the provider's dictation and/or activities during the visit.    Electronically signed by: De Blanch, OA, 10/03/22  8:49 AM  This document serves as a record of services personally performed by Karie Chimera, MD, PhD. It was created on their behalf by Glee Arvin. Manson Passey, OA an ophthalmic technician. The creation of this record is the provider's dictation and/or activities during the visit.    Electronically signed by: Glee Arvin. Manson Passey, [place] 04.10.2024 8:49 AM  Karie Chimera, M.D., Ph.D. Diseases & Surgery of the Retina and Vitreous [facility]  I have reviewed the above documentation for accuracy and completeness, and I agree with the above. Karie Chimera, M.D., Ph.D. 10/03/22 8:54 AM   Abbreviations: M myopia (nearsighted); A astigmatism; H hyperopia (farsighted); P presbyopia; Mrx spectacle prescription;  CTL contact lenses; OD right eye; OS left eye; OU both eyes  XT exotropia; ET esotropia; PEK punctate epithelial keratitis; PEE punctate epithelial erosions; DES dry eye syndrome; MGD meibomian gland dysfunction; ATs artificial tears; PFAT's preservative free artificial tears; NSC nuclear sclerotic cataract; PSC posterior subcapsular cataract; ERM epi-retinal membrane; PVD posterior vitreous detachment; RD retinal detachment; DM diabetes mellitus; DR diabetic retinopathy; NPDR non-proliferative diabetic retinopathy; PDR proliferative diabetic retinopathy; CSME clinically significant macular edema; DME diabetic macular edema; dbh dot blot hemorrhages; CWS cotton wool spot; POAG primary open angle glaucoma; C/D cup-to-disc ratio; HVF humphrey visual field; GVF goldmann visual field; OCT  optical coherence tomography; IOP intraocular pressure; BRVO Branch retinal vein occlusion; CRVO central retinal vein occlusion; CRAO central retinal artery occlusion; BRAO branch retinal artery occlusion; RT retinal tear; SB scleral buckle; PPV pars plana vitrectomy; VH Vitreous hemorrhage; PRP panretinal laser photocoagulation; IVK intravitreal kenalog; VMT vitreomacular traction; MH Macular hole;  NVD neovascularization of the disc; NVE neovascularization elsewhere; AREDS age related eye disease study; ARMD age related macular degeneration; POAG primary open angle glaucoma; EBMD epithelial/anterior basement membrane dystrophy; ACIOL anterior chamber intraocular lens; IOL intraocular lens; PCIOL posterior chamber intraocular lens; Phaco/IOL phacoemulsification with intraocular lens placement; PRK photorefractive keratectomy; LASIK laser assisted in situ keratomileusis; HTN hypertension; DM diabetes mellitus; COPD chronic obstructive pulmonary disease

## 2022-10-03 ENCOUNTER — Ambulatory Visit (INDEPENDENT_AMBULATORY_CARE_PROVIDER_SITE_OTHER): Payer: PPO | Admitting: Ophthalmology

## 2022-10-03 ENCOUNTER — Encounter (INDEPENDENT_AMBULATORY_CARE_PROVIDER_SITE_OTHER): Payer: Self-pay | Admitting: Ophthalmology

## 2022-10-03 DIAGNOSIS — I10 Essential (primary) hypertension: Secondary | ICD-10-CM | POA: Diagnosis not present

## 2022-10-03 DIAGNOSIS — H35033 Hypertensive retinopathy, bilateral: Secondary | ICD-10-CM | POA: Diagnosis not present

## 2022-10-03 DIAGNOSIS — E113313 Type 2 diabetes mellitus with moderate nonproliferative diabetic retinopathy with macular edema, bilateral: Secondary | ICD-10-CM | POA: Diagnosis not present

## 2022-10-03 DIAGNOSIS — H25813 Combined forms of age-related cataract, bilateral: Secondary | ICD-10-CM | POA: Diagnosis not present

## 2022-10-07 ENCOUNTER — Encounter (HOSPITAL_COMMUNITY): Payer: Self-pay

## 2022-10-07 ENCOUNTER — Ambulatory Visit (HOSPITAL_COMMUNITY)
Admission: EM | Admit: 2022-10-07 | Discharge: 2022-10-07 | Disposition: A | Discharge status: Home / Self Care | Payer: PPO

## 2022-10-07 DIAGNOSIS — B349 Viral infection, unspecified: Secondary | ICD-10-CM

## 2022-10-07 NOTE — Discharge Instructions (Addendum)
Most likely you have a viral illness: no antibiotic as indicated at this time, May treat with OTC meds of choice. Make sure to drink plenty of fluids to stay hydrated(gatorade, water, popsicles,jello,etc), avoid caffeine products. Follow up with PCP. Return as needed.May use Coricidin HBP as label directed for symptom management, chloraspetic throat lozenges as label directed. Tylenol as label directed for pain/fever.

## 2022-10-07 NOTE — ED Triage Notes (Signed)
sore throat, headache onset Friday and worse yesterday. No known sick exposure.   Tried metamucil sore throat and zyrtec with no relief.

## 2022-10-07 NOTE — ED Provider Notes (Signed)
[facility]    CSN: [id number] Arrival date & time: 10/07/22  1002      History   Chief Complaint Chief Complaint  Patient presents with   Sore Throat   Headache    HPI Kimberly Hammond is a 72 y.o. female.   72 year old female, Kimberly Hammond, presents to urgent care with chief complaint of sore throat, headache x 2 days. 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 Pt has been taking Mucinex sore throat lozenges and zyrtec w some relief. Pt's VS are normal in office today,afebrile. Pt wants to work tomorrow if ok  The history is provided by the patient. No language interpreter was used.    Past Medical History:  Diagnosis Date   Cataract    NS OU   Diabetes mellitus without complication    Diabetic retinopathy    NPDR OU   Hypertension    Hypertensive retinopathy    OU    Patient Active Problem List   Diagnosis Date Noted   Viral illness 10/07/2022    Past Surgical History:  Procedure Laterality Date   EYE SURGERY  2015   OS/OD   HYSTERECTOMY ABDOMINAL WITH SALPINGECTOMY      OB History   No obstetric history on file.      Home Medications    Prior to Admission medications   Medication Sig Start Date End Date Taking? Authorizing Provider  aspirin 81 MG chewable tablet Chew 81 mg by mouth daily.   Yes [provider]  atorvastatin (LIPITOR) 10 MG tablet Take 10 mg by mouth daily. 01/02/18  Yes [provider]  glipiZIDE (GLUCOTROL) 5 MG tablet Take 5 mg 2 (two) times daily before a meal by mouth.    Yes [provider]  insulin glargine (LANTUS) 100 UNIT/ML injection Inject 0.2 mLs (20 Units total) into the skin daily. (open vial expires 28 days after opening) 11/08/21  Yes   lisinopril-hydrochlorothiazide (PRINZIDE,ZESTORETIC) 20-25 MG tablet Take 1 tablet by mouth daily.   Yes [provider]  metFORMIN (GLUCOPHAGE) 500 MG tablet Take 1,000 mg by mouth 2 (two) times  daily with a meal.   Yes [provider]  metoprolol succinate (TOPROL-XL) 25 MG 24 hr tablet Take 25 mg by mouth daily.   Yes [provider]  Nutritional Supplements (VITAMIN D MAINTENANCE PO) Take 1 capsule by mouth daily.   Yes [provider]  Potassium 99 MG TABS Take 1 tablet by mouth daily.   Yes [provider]  RELION INSULIN SYR 0.5ML/31G 31G X 5/16" 0.5 ML MISC as directed. 12/25/17  Yes [provider]  benzonatate (TESSALON) 100 MG capsule Take 1 capsule (100 mg total) by mouth 3 (three) times daily as needed for cough. Patient not taking: Reported on 05/17/2021 11/15/20   Bing Neighbors, NP  ferrous sulfate 324 (65 Fe) MG TBEC Take 1 tablet by mouth daily.    [provider]  glipiZIDE (GLUCOTROL XL) 5 MG 24 hr tablet TAKE 1 TABLET BY MOUTH TWICE DAILY FOR 90 DAYS 10/17/18   [provider]  insulin glargine (LANTUS) 100 UNIT/ML injection Inject 24 Units into the skin at bedtime.    [provider]  metFORMIN (GLUCOPHAGE) 1000 MG tablet TAKE 1 TABLET BY MOUTH TWICE DAILY WITH A MEAL 12/30/18   [provider]  mupirocin ointment (BACTROBAN) 2 % Apply 1 application topically 2 (two) times daily. 04/21/17   Belinda Fisher,  PA-C  naproxen (NAPROSYN) 500 MG tablet Take 500 mg by mouth 2 (two) times daily with a meal. 04/05/17   [provider]  [facility] VERIO test strip as directed. 02/05/18   [provider]  polyethylene glycol-electrolytes (NULYTELY/GOLYTELY) 420 g solution See admin instructions. Patient not taking: Reported on 10/24/2020 01/12/19   [provider]    Family History Family History  Problem Relation Age of Onset   Hypertension Mother    Diabetes Maternal Aunt    Breast cancer Other     Social History Social History   Tobacco Use   Smoking status: Never   Smokeless tobacco: Never  Vaping Use   Vaping Use: Never used  Substance Use Topics   Alcohol use: No    Drug use: No     Allergies   Patient has no known allergies.   Review of Systems Review of Systems  Constitutional:  Negative for fever.  HENT:  Positive for sore throat.   Neurological:  Positive for headaches.  All other systems reviewed and are negative.    Physical Exam Triage Vital Signs ED Triage Vitals  Enc Vitals Group     BP --      Pulse --      Resp --      Temp --      Temp src --      SpO2 --      Weight 10/07/22 1023 185 lb (83.9 kg)     Height 10/07/22 1023  (1.549 m)     Head Circumference --      Peak Flow --      Pain Score 10/07/22 1021 10     Pain Loc --      Pain Edu? --      Excl. in GC? --    No data found.  Updated Vital Signs BP 105/67 (BP Location: Left Arm)   Pulse 76   Temp 98.2 F (36.8 C) (Oral)   Resp 18   Ht  (1.549 m)   Wt 185 lb (83.9 kg)   SpO2 97%   BMI 34.96 kg/m   Visual Acuity Right Eye Distance:   Left Eye Distance:   Bilateral Distance:    Right Eye Near:   Left Eye Near:    Bilateral Near:     Physical Exam Vitals and nursing note reviewed.  Constitutional:      Appearance: Normal appearance. She is well-developed and well-groomed.  HENT:     Head: Normocephalic.     Right Ear: Tympanic membrane is retracted.     Left Ear: Tympanic membrane is retracted.     Nose: Congestion present.     Mouth/Throat:     Lips: Pink.     Mouth: Mucous membranes are moist.     Pharynx: Oropharynx is clear.  Eyes:     General: Lids are normal.     Extraocular Movements: Extraocular movements intact.     Pupils: Pupils are equal, round, and reactive to light.  Cardiovascular:     Rate and Rhythm: Normal rate and regular rhythm.     Pulses: Normal pulses.     Heart sounds: Normal heart sounds.  Pulmonary:     Effort: Pulmonary effort is normal.     Breath sounds: Normal breath sounds and air entry.  Neurological:     General: No focal deficit present.     Mental Status: She is alert and oriented to  person, place, and  time.     GCS: GCS eye subscore is 4. GCS verbal subscore is 5. GCS motor subscore is 6.  Psychiatric:        Attention and Perception: Attention normal.        Mood and Affect: Mood normal.        Speech: Speech normal.        Behavior: Behavior normal. Behavior is cooperative.      UC Treatments / Results  Labs (all labs ordered are listed, but only abnormal results are displayed) Labs Reviewed - No data to display  EKG   Radiology No results found.  Procedures Procedures (including critical care time)  Medications Ordered in UC Medications - No data to display  Initial Impression / Assessment and Plan / UC Course  I have reviewed the triage vital signs and the nursing notes.  Pertinent labs & imaging results that were available during my care of the patient were reviewed by me and considered in my medical decision making (see chart for details).     Ddx: Viral illness, allergies Final Clinical Impressions(s) / UC Diagnoses   Final diagnoses:  Viral illness     Discharge Instructions      Most likely you have a viral illness: no antibiotic as indicated at this time, May treat with OTC meds of choice. Make sure to drink plenty of fluids to stay hydrated(gatorade, water, popsicles,jello,etc), avoid caffeine products. Follow up with PCP. Return as needed.May use Coricidin HBP as label directed for symptom management, chloraspetic throat lozenges as label directed. Tylenol as label directed for pain/fever.      ED Prescriptions   None    PDMP not reviewed this encounter.   Clancy Gourd, NP 10/07/22 1146

## 2022-11-01 ENCOUNTER — Other Ambulatory Visit: Payer: Self-pay | Admitting: Obstetrics and Gynecology

## 2022-11-01 DIAGNOSIS — N644 Mastodynia: Secondary | ICD-10-CM

## 2022-11-08 DIAGNOSIS — N644 Mastodynia: Secondary | ICD-10-CM | POA: Diagnosis not present

## 2022-11-08 DIAGNOSIS — N6489 Other specified disorders of breast: Secondary | ICD-10-CM | POA: Diagnosis not present

## 2022-12-14 DIAGNOSIS — E1165 Type 2 diabetes mellitus with hyperglycemia: Secondary | ICD-10-CM | POA: Diagnosis not present

## 2022-12-14 DIAGNOSIS — E7800 Pure hypercholesterolemia, unspecified: Secondary | ICD-10-CM | POA: Diagnosis not present

## 2022-12-17 ENCOUNTER — Other Ambulatory Visit: Payer: PPO

## 2023-01-04 DIAGNOSIS — E7800 Pure hypercholesterolemia, unspecified: Secondary | ICD-10-CM | POA: Diagnosis not present

## 2023-01-04 DIAGNOSIS — I10 Essential (primary) hypertension: Secondary | ICD-10-CM | POA: Diagnosis not present

## 2023-01-04 DIAGNOSIS — E669 Obesity, unspecified: Secondary | ICD-10-CM | POA: Diagnosis not present

## 2023-01-04 DIAGNOSIS — N8110 Cystocele, unspecified: Secondary | ICD-10-CM | POA: Diagnosis not present

## 2023-01-04 DIAGNOSIS — E1165 Type 2 diabetes mellitus with hyperglycemia: Secondary | ICD-10-CM | POA: Diagnosis not present

## 2023-01-15 ENCOUNTER — Other Ambulatory Visit (HOSPITAL_COMMUNITY): Payer: Self-pay

## 2023-01-16 ENCOUNTER — Other Ambulatory Visit (HOSPITAL_COMMUNITY): Payer: Self-pay

## 2023-01-16 ENCOUNTER — Other Ambulatory Visit: Payer: Self-pay

## 2023-01-16 MED ORDER — INSULIN GLARGINE 100 UNIT/ML ~~LOC~~ SOLN
6.0000 [IU] | Freq: Every evening | SUBCUTANEOUS | 11 refills | Status: DC
  Filled 2023-01-16: qty 10, 28d supply, fill #0
  Filled 2023-02-26: qty 10, 28d supply, fill #1
  Filled 2023-04-02: qty 10, 28d supply, fill #2
  Filled 2023-05-30: qty 10, 28d supply, fill #3
  Filled 2023-07-03: qty 10, 28d supply, fill #4
  Filled 2023-08-07 – 2023-08-12 (×2): qty 10, 28d supply, fill #5
  Filled 2023-09-17: qty 10, 28d supply, fill #6
  Filled 2023-10-09: qty 10, 28d supply, fill #7
  Filled 2023-11-06: qty 10, 28d supply, fill #8
  Filled 2023-12-04: qty 10, 28d supply, fill #9
  Filled 2023-12-25: qty 10, 28d supply, fill #10

## 2023-01-17 ENCOUNTER — Other Ambulatory Visit (HOSPITAL_COMMUNITY): Payer: Self-pay

## 2023-01-18 DIAGNOSIS — E113393 Type 2 diabetes mellitus with moderate nonproliferative diabetic retinopathy without macular edema, bilateral: Secondary | ICD-10-CM | POA: Diagnosis not present

## 2023-01-18 DIAGNOSIS — H2513 Age-related nuclear cataract, bilateral: Secondary | ICD-10-CM | POA: Diagnosis not present

## 2023-02-27 ENCOUNTER — Other Ambulatory Visit (HOSPITAL_COMMUNITY): Payer: Self-pay

## 2023-02-28 ENCOUNTER — Other Ambulatory Visit (HOSPITAL_COMMUNITY): Payer: Self-pay

## 2023-03-13 DIAGNOSIS — N8111 Cystocele, midline: Secondary | ICD-10-CM | POA: Diagnosis not present

## 2023-03-13 DIAGNOSIS — R350 Frequency of micturition: Secondary | ICD-10-CM | POA: Diagnosis not present

## 2023-03-21 DIAGNOSIS — N8111 Cystocele, midline: Secondary | ICD-10-CM | POA: Diagnosis not present

## 2023-03-21 DIAGNOSIS — N819 Female genital prolapse, unspecified: Secondary | ICD-10-CM | POA: Diagnosis not present

## 2023-03-21 DIAGNOSIS — Z01419 Encounter for gynecological examination (general) (routine) without abnormal findings: Secondary | ICD-10-CM | POA: Diagnosis not present

## 2023-04-02 ENCOUNTER — Other Ambulatory Visit (HOSPITAL_COMMUNITY): Payer: Self-pay

## 2023-05-30 ENCOUNTER — Other Ambulatory Visit (HOSPITAL_COMMUNITY): Payer: Self-pay

## 2023-06-12 DIAGNOSIS — N898 Other specified noninflammatory disorders of vagina: Secondary | ICD-10-CM | POA: Diagnosis not present

## 2023-06-12 DIAGNOSIS — R3989 Other symptoms and signs involving the genitourinary system: Secondary | ICD-10-CM | POA: Diagnosis not present

## 2023-07-03 ENCOUNTER — Other Ambulatory Visit (HOSPITAL_COMMUNITY): Payer: Self-pay

## 2023-07-08 DIAGNOSIS — E1165 Type 2 diabetes mellitus with hyperglycemia: Secondary | ICD-10-CM | POA: Diagnosis not present

## 2023-07-08 DIAGNOSIS — E7800 Pure hypercholesterolemia, unspecified: Secondary | ICD-10-CM | POA: Diagnosis not present

## 2023-07-15 ENCOUNTER — Other Ambulatory Visit (HOSPITAL_COMMUNITY): Payer: Self-pay

## 2023-07-15 MED ORDER — INSULIN GLARGINE 100 UNIT/ML ~~LOC~~ SOLN
16.0000 [IU] | Freq: Every day | SUBCUTANEOUS | 5 refills | Status: AC
  Filled 2023-07-15: qty 10, 60d supply, fill #0
  Filled 2024-02-12: qty 10, 28d supply, fill #0
  Filled 2024-04-13: qty 10, 28d supply, fill #1
  Filled 2024-05-11: qty 10, 28d supply, fill #2
  Filled 2024-06-08: qty 10, 28d supply, fill #3
  Filled 2024-06-29: qty 10, 28d supply, fill #4

## 2023-08-06 ENCOUNTER — Other Ambulatory Visit: Payer: Self-pay | Admitting: Family Medicine

## 2023-08-06 DIAGNOSIS — E2839 Other primary ovarian failure: Secondary | ICD-10-CM

## 2023-08-07 ENCOUNTER — Other Ambulatory Visit (HOSPITAL_COMMUNITY): Payer: Self-pay

## 2023-08-12 ENCOUNTER — Other Ambulatory Visit (HOSPITAL_COMMUNITY): Payer: Self-pay

## 2023-08-19 ENCOUNTER — Other Ambulatory Visit (HOSPITAL_COMMUNITY): Payer: Self-pay

## 2023-08-26 DIAGNOSIS — N8110 Cystocele, unspecified: Secondary | ICD-10-CM | POA: Diagnosis not present

## 2023-08-26 DIAGNOSIS — I10 Essential (primary) hypertension: Secondary | ICD-10-CM | POA: Diagnosis not present

## 2023-08-26 DIAGNOSIS — E7800 Pure hypercholesterolemia, unspecified: Secondary | ICD-10-CM | POA: Diagnosis not present

## 2023-08-26 DIAGNOSIS — E1165 Type 2 diabetes mellitus with hyperglycemia: Secondary | ICD-10-CM | POA: Diagnosis not present

## 2023-08-26 DIAGNOSIS — E669 Obesity, unspecified: Secondary | ICD-10-CM | POA: Diagnosis not present

## 2023-09-08 DIAGNOSIS — H2512 Age-related nuclear cataract, left eye: Secondary | ICD-10-CM | POA: Diagnosis not present

## 2023-09-17 DIAGNOSIS — H2512 Age-related nuclear cataract, left eye: Secondary | ICD-10-CM | POA: Diagnosis not present

## 2023-09-18 ENCOUNTER — Other Ambulatory Visit (HOSPITAL_COMMUNITY): Payer: Self-pay

## 2023-09-19 ENCOUNTER — Other Ambulatory Visit (HOSPITAL_COMMUNITY): Payer: Self-pay

## 2023-09-26 NOTE — Progress Notes (Signed)
 [facility] - Clinic Note  09/30/2023     CHIEF COMPLAINT Patient presents for Retina Follow Up   HISTORY OF PRESENT ILLNESS: Kimberly Hammond is a 73 y.o. female who presents to the clinic today for:   HPI     Retina Follow Up   Patient presents with  Diabetic Retinopathy.  In both eyes.  This started 1 year ago.  Duration of 1 year.  Since onset it is stable.  I, the attending physician,  performed the HPI with the patient and updated documentation appropriately.        Comments   1 year retina follow up NPDR OU pt is reporting no vision changes noticed she denies any flashes or floaters her last reading 140 her last A1C was 10 pt had cataract surgery March OD and OS she is reporting improved vision       Last edited by Ronelle Coffee, MD on 10/05/2023  1:49 AM.    Pt states her vision is doing well, her A1c was 10 in January, her insulin was increased at that time, she had cataract sx OU with Dr. Candi Chafe  Referring physician:  Lanae Pinal, MD [address] [place],  [place] [address]  HISTORICAL INFORMATION:   Selected notes from the MEDICAL RECORD NUMBER Referred form Dr. Anthoney Kipper for concern of macular edema OS;  Ocular Hx- NPDR OU; cataract OU;  PMH- Type 2 DM; HTN   CURRENT MEDICATIONS: No current outpatient medications on file. (Ophthalmic Drugs)   No current facility-administered medications for this visit. (Ophthalmic Drugs)   Current Outpatient Medications (Other)  Medication Sig   aspirin 81 MG chewable tablet Chew 81 mg by mouth daily.   atorvastatin (LIPITOR) 10 MG tablet Take 10 mg by mouth daily.   benzonatate (TESSALON) 100 MG capsule Take 1 capsule (100 mg total) by mouth 3 (three) times daily as needed for cough. (Patient not taking: Reported on 05/17/2021)   ferrous sulfate 324 (65 Fe) MG TBEC Take 1 tablet by mouth daily.   glipiZIDE (GLUCOTROL XL) 5 MG 24 hr tablet TAKE 1 TABLET BY MOUTH TWICE DAILY FOR 90 DAYS    glipiZIDE (GLUCOTROL) 5 MG tablet Take 5 mg 2 (two) times daily before a meal by mouth.    insulin glargine (LANTUS) 100 UNIT/ML injection Inject 24 Units into the skin at bedtime.   insulin glargine (LANTUS) 100 UNIT/ML injection Inject 0.06 mLs (6 Units total) into the skin every evening. Discard 28 days after first use.   insulin glargine (LANTUS) 100 UNIT/ML injection Inject 0.16 mLs (16 Units total) into the skin daily.   lisinopril-hydrochlorothiazide (PRINZIDE,ZESTORETIC) 20-25 MG tablet Take 1 tablet by mouth daily.   metFORMIN (GLUCOPHAGE) 1000 MG tablet TAKE 1 TABLET BY MOUTH TWICE DAILY WITH A MEAL   metFORMIN (GLUCOPHAGE) 500 MG tablet Take 1,000 mg by mouth 2 (two) times daily with a meal.   metoprolol succinate (TOPROL-XL) 25 MG 24 hr tablet Take 25 mg by mouth daily.   mupirocin ointment (BACTROBAN) 2 % Apply 1 application topically 2 (two) times daily.   naproxen (NAPROSYN) 500 MG tablet Take 500 mg by mouth 2 (two) times daily with a meal.   Nutritional Supplements (VITAMIN D MAINTENANCE PO) Take 1 capsule by mouth daily.   ONETOUCH VERIO test strip as directed.   polyethylene glycol-electrolytes (NULYTELY/GOLYTELY) 420 g solution See admin instructions. (Patient not taking: Reported on 10/24/2020)   Potassium 99 MG TABS Take 1  tablet by mouth daily.   RELION INSULIN SYR 0.5ML/31G 31G X 5/16" 0.5 ML MISC as directed.   No current facility-administered medications for this visit. (Other)   REVIEW OF SYSTEMS: ROS   Positive for: Endocrine, Eyes Negative for: Constitutional, Gastrointestinal, Neurological, Skin, Genitourinary, Musculoskeletal, HENT, Cardiovascular, Respiratory, Psychiatric, Allergic/Imm, Heme/Lymph Last edited by Alise Appl, COT on 09/30/2023  8:50 AM.     ALLERGIES No Known Allergies  PAST MEDICAL HISTORY Past Medical History:  Diagnosis Date   Cataract    NS OU   Diabetes mellitus without complication (HCC)    Diabetic retinopathy (HCC)     NPDR OU   Hypertension    Hypertensive retinopathy    OU   Past Surgical History:  Procedure Laterality Date   EYE SURGERY  2015   OS/OD   HYSTERECTOMY ABDOMINAL WITH SALPINGECTOMY     FAMILY HISTORY Family History  Problem Relation Age of Onset   Hypertension Mother    Diabetes Maternal Aunt    Breast cancer Other    SOCIAL HISTORY Social History   Tobacco Use   Smoking status: Never   Smokeless tobacco: Never  Vaping Use   Vaping status: Never Used  Substance Use Topics   Alcohol use: No   Drug use: No       OPHTHALMIC EXAM: Base Eye Exam     Visual Acuity (Snellen - Linear)       Right Left   Dist [place] 20/20 20/20         Tonometry (Tonopen, 8:57 AM)       Right Left   Pressure 14 15         Pupils       Pupils Dark Light Shape React APD   Right PERRL 3 2 Round Brisk None   Left PERRL 3 2 Round Brisk None         Visual Fields       Left Right    Full Full         Extraocular Movement       Right Left    Full, Ortho Full, Ortho         Neuro/Psych     Oriented x3: Yes   Mood/Affect: Normal         Dilation     Both eyes: 2.5% Phenylephrine @ 8:57 AM           Slit Lamp and Fundus Exam     Slit Lamp Exam       Right Left   Lids/Lashes Dermatochalasis - upper lid, Meibomian gland dysfunction Dermatochalasis - upper lid, Meibomian gland dysfunction   Conjunctiva/Sclera mild melanosis mild melanosis   Cornea Arcus, trace tear film debris, well healed cataract wound Arcus, well healed cataract wound, trace tear film debris   Anterior Chamber deep and clear deep and clear   Iris Round and dilated, No NVI Round and dilated, No NVI   Lens PC IOL in good position PC IOL in good position   Anterior Vitreous mild syneresis mild syneresis         Fundus Exam       Right Left   Disc No NVD, Pink and Sharp, Compact, PPP/PPA trace pallor, sharp rim, Compact, mild PPP   C/D Ratio 0.3 0.3   Macula Flat, good foveal  reflex, Retinal pigment epithelial mottling, rare MA greatest temporal macula, mild focal laser scars temporal macula Flat, blunted foveal reflex, RPE mottling, minimal MA's, +light focal  laser scars temporal macula, no exudates   Vessels attenuated, mild tortuosity attenuated, mild tortuosity   Periphery Attached, Rare MA / DBH greatest temporal periphery, No heme Attached, scattered MA, scattered RPE changes, good segmental PRP temporal periphery           Refraction     Wearing Rx       Sphere Cylinder Axis Add   Right -1.00 +0.50 088 +2.75   Left -2.00 +0.50 161 +2.75            IMAGING AND PROCEDURES  Imaging and Procedures for 09/26/17  OCT, Retina - OU - Both Eyes       Right Eye Quality was good. Central Foveal Thickness: 227. Progression has been stable. Findings include normal foveal contour, no IRF, no SRF, vitreomacular adhesion (Trace non central cystic changes temporal macula -- seen best on widefield, partial PVD).   Left Eye Quality was good. Central Foveal Thickness: 220. Progression has been stable. Findings include normal foveal contour, no IRF, no SRF, intraretinal hyper-reflective material, vitreomacular adhesion (stable improvement in trace cystic changes/IRF and IRHM IT macula).   Notes Images taken, stored on drive  Diagnosis / Impression:  NFP OU; No frank DME OD- Trace non central cystic changes temporal macula -- seen best on widefield, partial PVD OS- stable improvement in trace cystic changes/IRF and IRHM IT macula  Clinical management:  See below  Abbreviations: NFP - Normal foveal profile. CME - cystoid macular edema. PED - pigment epithelial detachment. IRF - intraretinal fluid. SRF - subretinal fluid. EZ - ellipsoid zone. ERM - epiretinal membrane. ORA - outer retinal atrophy. ORT - outer retinal tubulation. SRHM - subretinal hyper-reflective material             ASSESSMENT/PLAN:    ICD-10-CM   1. Moderate nonproliferative  diabetic retinopathy of both eyes with macular edema associated with type 2 diabetes mellitus (HCC)  E11.3313 OCT, Retina - OU - Both Eyes    2. Current use of insulin (HCC)  Z79.4     3. Essential hypertension  I10     4. Hypertensive retinopathy of both eyes  H35.033     5. Pseudophakia of both eyes  Z96.1      1,2. Moderate non-proliferative diabetic retinopathy, both eyes -- stable  - A1c: 10.1 on 01.13.25  - no NV noted on exam or prior FA  - S/P focal laser OS (03.20.19)  - S/P focal laser OD (04.04.19)  - s/p segmental PRP OS temporal periphery (12.21.22) for vascular nonperfusion  - non central diabetic macular edema OU -- essentially resolved now  - FA (11.23.22) shows OD: scattered MA w/o significant leakage; OS: Vascular nonperfusion w/ perivascular leakage temporal periphery -- s/p segmental PRP (12.21.22) as above  - repeat FA (06.09.23) shows OD: Mild peripheral vascular perfusion defect temporally, mild perivascular leakage temporal periphery, no NV; OS: Vascular nonperfusion with mild perivascular leakage temporal periphery -- improved from prior, s/p segmental PRP, no NV  - BCVA remains good / stable (20/20 OU)  - OCT shows OD- Trace non central cystic changes temporal macula -- seen best on widefield; OS- stable improvement in trace cystic changes/IRF and IRHM IT macula; no DME OU - no retinal or ophthalmic interventions indicated or recommended  - f/u 1 year -- DFE/OCT  3,4. Hypertensive retinopathy OU  - discussed importance of tight BP control  - monitor   5.Pseudophakia OU  - s/p CE/IOL (Dr. Anthoney Kipper, March 2025)  - IOL in  good position, doing well  - monitor  Ophthalmic Meds Ordered this visit:  No orders of the defined types were placed in this encounter.    Return in about 1 year (around 09/29/2024) for f/u NPDR OU, DFE, OCT.  There are no Patient Instructions on file for this visit.   This document serves as a record of services personally performed  by Jeanice Millard, MD, PhD. It was created on their behalf by Morley Arabia. Bevin Bucks, OA an ophthalmic technician. The creation of this record is the provider's dictation and/or activities during the visit.    Electronically signed by: Morley Arabia. Bevin Bucks, OA 10/05/23 1:49 AM  Jeanice Millard, M.D., Ph.D. Diseases & Surgery of the Retina and Vitreous [facility]  I have reviewed the above documentation for accuracy and completeness, and I agree with the above. Jeanice Millard, M.D., Ph.D. 10/05/23 1:52 AM   Abbreviations: M myopia (nearsighted); A astigmatism; H hyperopia (farsighted); P presbyopia; Mrx spectacle prescription;  CTL contact lenses; OD right eye; OS left eye; OU both eyes  XT exotropia; ET esotropia; PEK punctate epithelial keratitis; PEE punctate epithelial erosions; DES dry eye syndrome; MGD meibomian gland dysfunction; ATs artificial tears; PFAT's preservative free artificial tears; NSC nuclear sclerotic cataract; PSC posterior subcapsular cataract; ERM epi-retinal membrane; PVD posterior vitreous detachment; RD retinal detachment; DM diabetes mellitus; DR diabetic retinopathy; NPDR non-proliferative diabetic retinopathy; PDR proliferative diabetic retinopathy; CSME clinically significant macular edema; DME diabetic macular edema; dbh dot blot hemorrhages; CWS cotton wool spot; POAG primary open angle glaucoma; C/D cup-to-disc ratio; HVF humphrey visual field; GVF goldmann visual field; OCT optical coherence tomography; IOP intraocular pressure; BRVO Branch retinal vein occlusion; CRVO central retinal vein occlusion; CRAO central retinal artery occlusion; BRAO branch retinal artery occlusion; RT retinal tear; SB scleral buckle; PPV pars plana vitrectomy; VH Vitreous hemorrhage; PRP panretinal laser photocoagulation; IVK intravitreal kenalog; VMT vitreomacular traction; MH Macular hole;  NVD neovascularization of the disc; NVE neovascularization elsewhere; AREDS age related  eye disease study; ARMD age related macular degeneration; POAG primary open angle glaucoma; EBMD epithelial/anterior basement membrane dystrophy; ACIOL anterior chamber intraocular lens; IOL intraocular lens; PCIOL posterior chamber intraocular lens; Phaco/IOL phacoemulsification with intraocular lens placement; PRK photorefractive keratectomy; LASIK laser assisted in situ keratomileusis; HTN hypertension; DM diabetes mellitus; COPD chronic obstructive pulmonary disease

## 2023-09-30 ENCOUNTER — Ambulatory Visit (INDEPENDENT_AMBULATORY_CARE_PROVIDER_SITE_OTHER): Payer: PPO | Admitting: Ophthalmology

## 2023-09-30 ENCOUNTER — Encounter (INDEPENDENT_AMBULATORY_CARE_PROVIDER_SITE_OTHER): Payer: Self-pay | Admitting: Ophthalmology

## 2023-09-30 DIAGNOSIS — H35033 Hypertensive retinopathy, bilateral: Secondary | ICD-10-CM

## 2023-09-30 DIAGNOSIS — Z961 Presence of intraocular lens: Secondary | ICD-10-CM

## 2023-09-30 DIAGNOSIS — Z794 Long term (current) use of insulin: Secondary | ICD-10-CM

## 2023-09-30 DIAGNOSIS — E113313 Type 2 diabetes mellitus with moderate nonproliferative diabetic retinopathy with macular edema, bilateral: Secondary | ICD-10-CM | POA: Diagnosis not present

## 2023-09-30 DIAGNOSIS — I10 Essential (primary) hypertension: Secondary | ICD-10-CM | POA: Diagnosis not present

## 2023-09-30 DIAGNOSIS — H25813 Combined forms of age-related cataract, bilateral: Secondary | ICD-10-CM

## 2023-10-05 ENCOUNTER — Encounter (INDEPENDENT_AMBULATORY_CARE_PROVIDER_SITE_OTHER): Payer: Self-pay | Admitting: Ophthalmology

## 2023-10-15 ENCOUNTER — Other Ambulatory Visit: Payer: Self-pay | Admitting: Obstetrics and Gynecology

## 2023-10-15 DIAGNOSIS — N644 Mastodynia: Secondary | ICD-10-CM

## 2023-12-18 ENCOUNTER — Ambulatory Visit
Admission: RE | Admit: 2023-12-18 | Discharge: 2023-12-18 | Disposition: A | Discharge status: Home / Self Care | Source: Ambulatory Visit | Attending: Obstetrics and Gynecology | Admitting: Obstetrics and Gynecology

## 2023-12-18 ENCOUNTER — Other Ambulatory Visit: Payer: Self-pay | Admitting: Obstetrics and Gynecology

## 2023-12-18 ENCOUNTER — Ambulatory Visit

## 2023-12-18 DIAGNOSIS — N644 Mastodynia: Secondary | ICD-10-CM

## 2023-12-18 DIAGNOSIS — Z1231 Encounter for screening mammogram for malignant neoplasm of breast: Secondary | ICD-10-CM | POA: Diagnosis not present

## 2023-12-30 DIAGNOSIS — E7800 Pure hypercholesterolemia, unspecified: Secondary | ICD-10-CM | POA: Diagnosis not present

## 2023-12-30 DIAGNOSIS — E1165 Type 2 diabetes mellitus with hyperglycemia: Secondary | ICD-10-CM | POA: Diagnosis not present

## 2024-01-06 DIAGNOSIS — E1165 Type 2 diabetes mellitus with hyperglycemia: Secondary | ICD-10-CM | POA: Diagnosis not present

## 2024-01-06 DIAGNOSIS — N8110 Cystocele, unspecified: Secondary | ICD-10-CM | POA: Diagnosis not present

## 2024-01-06 DIAGNOSIS — E669 Obesity, unspecified: Secondary | ICD-10-CM | POA: Diagnosis not present

## 2024-01-06 DIAGNOSIS — I10 Essential (primary) hypertension: Secondary | ICD-10-CM | POA: Diagnosis not present

## 2024-01-06 DIAGNOSIS — E7800 Pure hypercholesterolemia, unspecified: Secondary | ICD-10-CM | POA: Diagnosis not present

## 2024-01-14 DIAGNOSIS — M1712 Unilateral primary osteoarthritis, left knee: Secondary | ICD-10-CM | POA: Diagnosis not present

## 2024-01-14 DIAGNOSIS — M1711 Unilateral primary osteoarthritis, right knee: Secondary | ICD-10-CM | POA: Diagnosis not present

## 2024-01-14 DIAGNOSIS — M170 Bilateral primary osteoarthritis of knee: Secondary | ICD-10-CM | POA: Diagnosis not present

## 2024-01-22 ENCOUNTER — Other Ambulatory Visit: Payer: Self-pay

## 2024-01-22 ENCOUNTER — Other Ambulatory Visit (HOSPITAL_COMMUNITY): Payer: Self-pay

## 2024-01-22 MED ORDER — INSULIN GLARGINE 100 UNIT/ML ~~LOC~~ SOLN
16.0000 [IU] | Freq: Every day | SUBCUTANEOUS | 5 refills | Status: AC
  Filled 2024-01-22: qty 10, 28d supply, fill #0
  Filled 2024-03-20 (×2): qty 10, 28d supply, fill #1
  Filled 2024-07-27: qty 10, 28d supply, fill #2

## 2024-02-12 ENCOUNTER — Other Ambulatory Visit (HOSPITAL_COMMUNITY): Payer: Self-pay

## 2024-02-12 ENCOUNTER — Other Ambulatory Visit: Payer: Self-pay

## 2024-03-20 ENCOUNTER — Other Ambulatory Visit (HOSPITAL_BASED_OUTPATIENT_CLINIC_OR_DEPARTMENT_OTHER): Payer: Self-pay

## 2024-03-20 ENCOUNTER — Other Ambulatory Visit: Payer: Self-pay

## 2024-03-20 ENCOUNTER — Other Ambulatory Visit (HOSPITAL_COMMUNITY): Payer: Self-pay

## 2024-03-23 ENCOUNTER — Other Ambulatory Visit (HOSPITAL_BASED_OUTPATIENT_CLINIC_OR_DEPARTMENT_OTHER): Payer: Self-pay

## 2024-03-23 ENCOUNTER — Other Ambulatory Visit: Payer: Self-pay

## 2024-03-31 ENCOUNTER — Ambulatory Visit (HOSPITAL_BASED_OUTPATIENT_CLINIC_OR_DEPARTMENT_OTHER)
Admission: RE | Admit: 2024-03-31 | Discharge: 2024-03-31 | Disposition: A | Discharge status: Home / Self Care | Source: Ambulatory Visit | Attending: Family Medicine | Admitting: Family Medicine

## 2024-03-31 ENCOUNTER — Other Ambulatory Visit: Payer: PPO

## 2024-03-31 DIAGNOSIS — E2839 Other primary ovarian failure: Secondary | ICD-10-CM | POA: Insufficient documentation

## 2024-03-31 DIAGNOSIS — M85852 Other specified disorders of bone density and structure, left thigh: Secondary | ICD-10-CM | POA: Diagnosis not present

## 2024-03-31 DIAGNOSIS — M85851 Other specified disorders of bone density and structure, right thigh: Secondary | ICD-10-CM | POA: Diagnosis not present

## 2024-03-31 DIAGNOSIS — Z780 Asymptomatic menopausal state: Secondary | ICD-10-CM | POA: Diagnosis not present

## 2024-04-07 DIAGNOSIS — E7800 Pure hypercholesterolemia, unspecified: Secondary | ICD-10-CM | POA: Diagnosis not present

## 2024-04-14 DIAGNOSIS — E7800 Pure hypercholesterolemia, unspecified: Secondary | ICD-10-CM | POA: Diagnosis not present

## 2024-04-14 DIAGNOSIS — I10 Essential (primary) hypertension: Secondary | ICD-10-CM | POA: Diagnosis not present

## 2024-04-14 DIAGNOSIS — E669 Obesity, unspecified: Secondary | ICD-10-CM | POA: Diagnosis not present

## 2024-04-14 DIAGNOSIS — E1165 Type 2 diabetes mellitus with hyperglycemia: Secondary | ICD-10-CM | POA: Diagnosis not present

## 2024-04-14 DIAGNOSIS — Z23 Encounter for immunization: Secondary | ICD-10-CM | POA: Diagnosis not present

## 2024-04-14 DIAGNOSIS — N8110 Cystocele, unspecified: Secondary | ICD-10-CM | POA: Diagnosis not present

## 2024-05-03 ENCOUNTER — Other Ambulatory Visit: Payer: Self-pay

## 2024-05-03 ENCOUNTER — Encounter (HOSPITAL_COMMUNITY): Payer: Self-pay | Admitting: *Deleted

## 2024-05-03 ENCOUNTER — Ambulatory Visit (INDEPENDENT_AMBULATORY_CARE_PROVIDER_SITE_OTHER)

## 2024-05-03 ENCOUNTER — Ambulatory Visit (HOSPITAL_COMMUNITY): Admission: EM | Admit: 2024-05-03 | Discharge: 2024-05-03 | Disposition: A | Discharge status: Home / Self Care

## 2024-05-03 DIAGNOSIS — B349 Viral infection, unspecified: Secondary | ICD-10-CM

## 2024-05-03 DIAGNOSIS — R053 Chronic cough: Secondary | ICD-10-CM | POA: Diagnosis not present

## 2024-05-03 MED ORDER — PROMETHAZINE-DM 6.25-15 MG/5ML PO SYRP: 10.0000 mL | ORAL_SOLUTION | Freq: Three times a day (TID) | ORAL | 0 refills | Status: DC | PRN

## 2024-05-03 MED ORDER — PREDNISONE 20 MG PO TABS: 40.0000 mg | ORAL_TABLET | Freq: Every day | ORAL | 0 refills | Status: AC

## 2024-05-03 MED ORDER — AZELASTINE HCL 0.1 % NA SOLN: 1.0000 | Freq: Two times a day (BID) | NASAL | 1 refills | Status: DC

## 2024-05-03 NOTE — ED Provider Notes (Signed)
 [facility]  Note:  This document was prepared using Conservation officer, historic buildings and may include unintentional dictation errors.  MRN: [id number] DOB: [birth year]  Subjective:   Kimberly Hammond is a 73 y.o. female presenting for evaluation of cough, nasal congestion, chills, fatigue x 4 days.  Patient reports that she has persistent hot and cold spells.  Patient has persistent fatigue since the onset of symptoms.  Patient denies any known sick contacts.  Patient reports that she has been taking Mucinex and using Vicks VapoRub with minimal improvement to symptoms.  Patient states that cough is persistent throughout the day but is also problematic at night when lying down.  Patient denies chest pain, shortness of breath, weakness, dizziness.  No current facility-administered medications for this encounter.  Current Outpatient Medications:    aspirin 81 MG chewable tablet, Chew 81 mg by mouth daily., Disp: , Rfl:    atorvastatin (LIPITOR) 10 MG tablet, Take 10 mg by mouth daily., Disp: , Rfl: 2   azelastine (ASTELIN) 0.1 % nasal spray, Place 1 spray into both nostrils 2 (two) times daily. Use in each nostril as directed, Disp: 30 mL, Rfl: 1   ferrous sulfate 324 (65 Fe) MG TBEC, Take 1 tablet by mouth daily., Disp: , Rfl:    glipiZIDE (GLUCOTROL) 5 MG tablet, Take 5 mg 2 (two) times daily before a meal by mouth. , Disp: , Rfl:    insulin  glargine (LANTUS ) 100 UNIT/ML injection, Inject 0.16 mLs (16 Units total) into the skin daily., Disp: 10 mL, Rfl: 5   insulin  glargine (LANTUS ) 100 UNIT/ML injection, Inject 16 units into the skin once daily. Vial expires 28 days after opening, Disp: 10 mL, Rfl: 5   lisinopril-hydrochlorothiazide (PRINZIDE,ZESTORETIC) 20-25 MG tablet, Take 1 tablet by mouth daily., Disp: , Rfl:    metoprolol succinate (TOPROL-XL) 25 MG 24 hr tablet, Take 25 mg by mouth daily., Disp: , Rfl:    mupirocin  ointment (BACTROBAN ) 2 %, Apply 1 application topically 2  (two) times daily., Disp: 22 g, Rfl: 0   Nutritional Supplements (VITAMIN D MAINTENANCE PO), Take 1 capsule by mouth daily., Disp: , Rfl:    ONETOUCH VERIO test strip, as directed., Disp: , Rfl: 12   Potassium 99 MG TABS, Take 1 tablet by mouth daily., Disp: , Rfl:    predniSONE (DELTASONE) 20 MG tablet, Take 2 tablets (40 mg total) by mouth daily for 5 days., Disp: 10 tablet, Rfl: 0   promethazine-dextromethorphan (PROMETHAZINE-DM) 6.25-15 MG/5ML syrup, Take 10 mLs by mouth 3 (three) times daily as needed for cough., Disp: 240 mL, Rfl: 0   benzonatate  (TESSALON ) 100 MG capsule, Take 1 capsule (100 mg total) by mouth 3 (three) times daily as needed for cough. (Patient not taking: Reported on 05/17/2021), Disp: 40 capsule, Rfl: 0   glipiZIDE (GLUCOTROL XL) 5 MG 24 hr tablet, TAKE 1 TABLET BY MOUTH TWICE DAILY FOR 90 DAYS, Disp: , Rfl:    insulin  glargine (LANTUS ) 100 UNIT/ML injection, Inject 24 Units into the skin at bedtime., Disp: , Rfl:    metFORMIN (GLUCOPHAGE) 1000 MG tablet, TAKE 1 TABLET BY MOUTH TWICE DAILY WITH A MEAL, Disp: , Rfl:    metFORMIN (GLUCOPHAGE) 500 MG tablet, Take 1,000 mg by mouth 2 (two) times daily with a meal., Disp: , Rfl:    naproxen (NAPROSYN) 500 MG tablet, Take 500 mg by mouth 2 (two) times daily with a meal., Disp: , Rfl:    polyethylene glycol-electrolytes (NULYTELY/GOLYTELY) 420 g solution,  See admin instructions. (Patient not taking: Reported on 10/24/2020), Disp: , Rfl:    RELION INSULIN  SYR 0.5ML/31G 31G X 5/16 0.5 ML MISC, as directed., Disp: , Rfl: 0   No Known Allergies  Past Medical History:  Diagnosis Date   Cataract    NS OU   Diabetes mellitus without complication (HCC)    Diabetic retinopathy (HCC)    NPDR OU   Hypertension    Hypertensive retinopathy    OU     Past Surgical History:  Procedure Laterality Date   EYE SURGERY  2015   OS/OD   HYSTERECTOMY ABDOMINAL WITH SALPINGECTOMY      Family History  Problem Relation Age of Onset    Hypertension Mother    Diabetes Maternal Aunt    Breast cancer Other     Social History   Tobacco Use   Smoking status: Never   Smokeless tobacco: Never  Vaping Use   Vaping status: Never Used  Substance Use Topics   Alcohol use: No   Drug use: No    ROS Refer to HPI for ROS details.  Objective:    Vitals: BP 109/74   Pulse 78   Temp 98.1 F (36.7 C)   Resp 20   SpO2 96%   Physical Exam Vitals and nursing note reviewed.  Constitutional:      General: She is not in acute distress.    Appearance: Normal appearance. She is well-developed. She is not ill-appearing or toxic-appearing.  HENT:     Head: Normocephalic and atraumatic.     Nose: Congestion present. No rhinorrhea.     Mouth/Throat:     Mouth: Mucous membranes are moist.     Pharynx: Oropharynx is clear.  Eyes:     Extraocular Movements: Extraocular movements intact.     Conjunctiva/sclera: Conjunctivae normal.  Cardiovascular:     Rate and Rhythm: Normal rate and regular rhythm.     Heart sounds: Normal heart sounds. No murmur heard. Pulmonary:     Effort: Pulmonary effort is normal. No respiratory distress.     Breath sounds: Normal breath sounds. No stridor. No wheezing, rhonchi or rales.  Chest:     Chest wall: No tenderness.  Skin:    General: Skin is warm and dry.  Neurological:     General: No focal deficit present.     Mental Status: She is alert and oriented to person, place, and time.  Psychiatric:        Mood and Affect: Mood normal.        Behavior: Behavior normal.     Procedures  No results found for this or any previous visit (from the past 24 hours).  Assessment and Plan :     Discharge Instructions       1. Viral illness (Primary) - DG Chest 2 View x-ray performed in UC shows no acute cardiopulmonary processes, no sign of consolidation or pneumonia. - azelastine (ASTELIN) 0.1 % nasal spray; Place 1 spray into both nostrils 2 (two) times daily. Use in each nostril as  directed  Dispense: 30 mL; Refill: 1 - promethazine-dextromethorphan (PROMETHAZINE-DM) 6.25-15 MG/5ML syrup; Take 10 mLs by mouth 3 (three) times daily as needed for cough.  Dispense: 240 mL; Refill: 0 - predniSONE (DELTASONE) 20 MG tablet; Take 2 tablets (40 mg total) by mouth daily for 5 days.  Dispense: 10 tablet; Refill: 0 -Continue to monitor symptoms for any change in severity if there is any escalation of current symptoms or development  of new symptoms follow-up in ER for further evaluation and management.      Jailey Booton B Moo Gravley   Oria Klimas, [place], [place] 05/03/24 [phone]

## 2024-05-03 NOTE — Discharge Instructions (Signed)
  1. Viral illness (Primary) - DG Chest 2 View x-ray performed in UC shows no acute cardiopulmonary processes, no sign of consolidation or pneumonia. - azelastine (ASTELIN) 0.1 % nasal spray; Place 1 spray into both nostrils 2 (two) times daily. Use in each nostril as directed  Dispense: 30 mL; Refill: 1 - promethazine-dextromethorphan (PROMETHAZINE-DM) 6.25-15 MG/5ML syrup; Take 10 mLs by mouth 3 (three) times daily as needed for cough.  Dispense: 240 mL; Refill: 0 - predniSONE (DELTASONE) 20 MG tablet; Take 2 tablets (40 mg total) by mouth daily for 5 days.  Dispense: 10 tablet; Refill: 0 -Continue to monitor symptoms for any change in severity if there is any escalation of current symptoms or development of new symptoms follow-up in ER for further evaluation and management.

## 2024-05-03 NOTE — ED Triage Notes (Signed)
 PT reports she has had a cough and fatigue since last Thursday. PT reports she has been hot and cold . Pt feels tired all the time.

## 2024-05-27 DIAGNOSIS — E7800 Pure hypercholesterolemia, unspecified: Secondary | ICD-10-CM | POA: Diagnosis not present

## 2024-05-27 DIAGNOSIS — E669 Obesity, unspecified: Secondary | ICD-10-CM | POA: Diagnosis not present

## 2024-05-27 DIAGNOSIS — N8110 Cystocele, unspecified: Secondary | ICD-10-CM | POA: Diagnosis not present

## 2024-05-27 DIAGNOSIS — I10 Essential (primary) hypertension: Secondary | ICD-10-CM | POA: Diagnosis not present

## 2024-05-27 DIAGNOSIS — E1165 Type 2 diabetes mellitus with hyperglycemia: Secondary | ICD-10-CM | POA: Diagnosis not present

## 2024-05-29 DIAGNOSIS — Z961 Presence of intraocular lens: Secondary | ICD-10-CM | POA: Diagnosis not present

## 2024-05-29 DIAGNOSIS — E113393 Type 2 diabetes mellitus with moderate nonproliferative diabetic retinopathy without macular edema, bilateral: Secondary | ICD-10-CM | POA: Diagnosis not present

## 2024-06-05 ENCOUNTER — Telehealth (HOSPITAL_COMMUNITY): Payer: Self-pay | Admitting: Internal Medicine

## 2024-06-05 ENCOUNTER — Encounter (HOSPITAL_COMMUNITY): Payer: Self-pay | Admitting: Internal Medicine

## 2024-06-05 ENCOUNTER — Ambulatory Visit (HOSPITAL_COMMUNITY): Admit: 2024-06-05 | Discharge: 2024-06-05 | Disposition: A | Attending: Internal Medicine

## 2024-06-05 ENCOUNTER — Ambulatory Visit (HOSPITAL_COMMUNITY)
Admission: EM | Admit: 2024-06-05 | Discharge: 2024-06-05 | Disposition: A | Discharge status: Home / Self Care | Attending: Internal Medicine | Admitting: Internal Medicine

## 2024-06-05 ENCOUNTER — Encounter (HOSPITAL_COMMUNITY): Payer: Self-pay | Admitting: *Deleted

## 2024-06-05 ENCOUNTER — Other Ambulatory Visit: Payer: Self-pay

## 2024-06-05 ENCOUNTER — Other Ambulatory Visit (HOSPITAL_COMMUNITY): Payer: Self-pay

## 2024-06-05 DIAGNOSIS — M79661 Pain in right lower leg: Secondary | ICD-10-CM

## 2024-06-05 DIAGNOSIS — I82441 Acute embolism and thrombosis of right tibial vein: Secondary | ICD-10-CM

## 2024-06-05 MED ORDER — APIXABAN (ELIQUIS) VTE STARTER PACK (10MG AND 5MG)
ORAL_TABLET | ORAL | 0 refills | Status: AC
  Filled 2024-06-05: qty 74, 30d supply, fill #0

## 2024-06-05 NOTE — Discharge Instructions (Signed)
 Please go to the heart and vascular Center at [address], [place], [place] at 1 PM to have a DVT ultrasound performed of your right leg.  I suspect that this ultrasound will be negative for blood clot, however I would like to make sure that you do not have an emergent condition causing your right calf pain.  Staff will call if your ultrasound is abnormal.  Once we get the results of your ultrasound and it is negative, we will treat you with a muscle relaxer called baclofen.  You may take baclofen muscle relaxer 5 mg every 12 hours as needed for muscle spasms and aches/pains to the right calf.  This medication can make you sleepy so do not drink alcohol, drive, or go to work while taking the muscle relaxer (baclofen).  Continue taking Tylenol 1000 mg every 6 hours as needed for pain and inflammation of the right calf.  Use heat and gentle range of motion stretches/exercises to the calf to reduce pain.  Take multiple breaks at work to rest and relieve muscle spasm.  I have provided a work note at the back of your packet for you.  Please schedule a follow-up appointment with your primary care provider and the orthopedic provider listed on your paperwork.   Go to the ER or return to urgent care if you develop worsening swelling of the right calf, redness/warmth to the calf, or shortness of breath or chest pain.

## 2024-06-05 NOTE — Progress Notes (Signed)
 Right lower extremity DVT exam performed. Patient made aware of critical findings and declined appt with DVT clinic due to conflict with another appt.  Patient requested appt with DVT clinic for Monday morning 06/08/2024.  Patient has been instructed to return to urgent care if symptoms worsen and has been given a pamphlet on DVT.  Scheduler [place] is currently working with patient and DVT clinic for a Monday morning appt.

## 2024-06-05 NOTE — Telephone Encounter (Signed)
 Spoke with patient regarding her DVT study results while she was still in the room with DVT ultrasound technician. Patient was found to have an acute DVT involving one of the paired proximal posterior tibial veins of the right calf on ultrasound today. She will require treatment with Eliquis starter pack.  This has been sent to the pharmacy at [facility] outpatient pharmacy. Discussed risks of bleeding with taking blood thinner including immediate ER visit for any head injuries and taking extra caution with sharp objects, shaving, and minor injuries which could cause significant bruising. She declines treatment at the DVT clinic today but states she is willing to follow-up with them on Monday, June 08, 2024. No signs of pulmonary embolism on exam today.  Discussed ER precautions should she develop shortness of breath, chest tightness, syncope, fever, etc. Patient expressed understanding and agreement with plan.  All questions answered.

## 2024-06-05 NOTE — ED Triage Notes (Signed)
 PT reports since Nov. 27 she has RT leg pain . Pt reports her RT calf and the upper Leg aches with activity. 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 Pt tok Ibuprofen with some relief.

## 2024-06-05 NOTE — ED Provider Notes (Signed)
 [facility]    CSN: [id number] Arrival date & time: 06/05/24  9196      History   Chief Complaint Chief Complaint  Patient presents with   Leg Pain    HPI Kimberly Hammond is a 73 y.o. female.   Kimberly Hammond is a 73 y.o. female presenting for chief complaint of right leg pain that started 3 to 4 weeks ago.  Pain is an achy sensation to the right posterior calf that is triggered by ambulation and weightbearing activity.  Pain improves slightly when she sits down to rest but does not completely resolve until she sits down for a very long time/goes to sleep overnight.  She has noticed the right calf has been a little bit more swollen than the left for the last few weeks.  She denies recent long periods of travel, history of DVT, history of malignancy, recent sedentary activity, shortness of breath, chest pain, fever/chills, redness/warmth to the right calf, and open wounds to the right calf.  Denies recent trauma/injuries to the right calf or the right leg.  She has a diabetic with history of diabetic neuropathy but states this calf achy pain is a feeling she has never felt in the past but does not feel similar to her diabetic neuropathy pain.  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  Prior to this, she was not as active as she is at this time.  Denies history of peripheral vascular disease.  She does not use compression socks at work.  She has not attempted treatment of symptoms at home.   Leg Pain   Past Medical History:  Diagnosis Date   Cataract    NS OU   Diabetes mellitus without complication (HCC)    Diabetic retinopathy (HCC)    NPDR OU   Hypertension    Hypertensive retinopathy    OU    Patient Active Problem List   Diagnosis Date Noted   Viral illness 10/07/2022    Past Surgical History:  Procedure Laterality Date   EYE SURGERY  2015   OS/OD   HYSTERECTOMY ABDOMINAL WITH SALPINGECTOMY      OB History   No obstetric  history on file.      Home Medications    Prior to Admission medications  Medication Sig Start Date End Date Taking? Authorizing Provider  aspirin 81 MG chewable tablet Chew 81 mg by mouth daily.   Yes [provider]  atorvastatin (LIPITOR) 10 MG tablet Take 10 mg by mouth daily. 01/02/18  Yes [provider]  insulin  glargine (LANTUS ) 100 UNIT/ML injection Inject 0.16 mLs (16 Units total) into the skin daily. 07/15/23  Yes   lisinopril-hydrochlorothiazide (PRINZIDE,ZESTORETIC) 20-25 MG tablet Take 1 tablet by mouth daily.   Yes [provider]  metFORMIN (GLUCOPHAGE) 1000 MG tablet TAKE 1 TABLET BY MOUTH TWICE DAILY WITH A MEAL 12/30/18  Yes [provider]  metoprolol succinate (TOPROL-XL) 25 MG 24 hr tablet Take 25 mg by mouth daily.   Yes [provider]  Potassium 99 MG TABS Take 1 tablet by mouth daily.   Yes [provider]  azelastine  (ASTELIN ) 0.1 % nasal spray Place 1 spray into both nostrils 2 (two) times daily. Use in each nostril as directed 05/03/24   Reddick, Johnathan B, NP  benzonatate  (TESSALON ) 100 MG capsule Take 1 capsule (100 mg total) by mouth 3 (three) times daily as needed for cough. Patient not taking: Reported on 05/17/2021  11/15/20   Arloa Suzen RAMAN, NP  ferrous sulfate 324 (65 Fe) MG TBEC Take 1 tablet by mouth daily.    [provider]  glipiZIDE (GLUCOTROL XL) 5 MG 24 hr tablet TAKE 1 TABLET BY MOUTH TWICE DAILY FOR 90 DAYS 10/17/18   [provider]  glipiZIDE (GLUCOTROL) 5 MG tablet Take 5 mg 2 (two) times daily before a meal by mouth.     [provider]  insulin  glargine (LANTUS ) 100 UNIT/ML injection Inject 24 Units into the skin at bedtime.    [provider]  insulin  glargine (LANTUS ) 100 UNIT/ML injection Inject 16 units into the skin once daily. Vial expires 28 days after opening 01/22/24     metFORMIN (GLUCOPHAGE) 500 MG tablet Take 1,000 mg by mouth 2 (two) times  daily with a meal.    [provider]  mupirocin  ointment (BACTROBAN ) 2 % Apply 1 application topically 2 (two) times daily. 04/21/17   Babara, Amy V, PA-C  naproxen (NAPROSYN) 500 MG tablet Take 500 mg by mouth 2 (two) times daily with a meal. 04/05/17   [provider]  Nutritional Supplements (VITAMIN D MAINTENANCE PO) Take 1 capsule by mouth daily.    [provider]  [facility] VERIO test strip as directed. 02/05/18   [provider]  polyethylene glycol-electrolytes (NULYTELY/GOLYTELY) 420 g solution See admin instructions. Patient not taking: Reported on 10/24/2020 01/12/19   [provider]  promethazine -dextromethorphan (PROMETHAZINE -DM) 6.25-15 MG/5ML syrup Take 10 mLs by mouth 3 (three) times daily as needed for cough. 05/03/24   Reddick, Johnathan B, NP  RELION INSULIN  SYR 0.5ML/31G 31G X 5/16 0.5 ML MISC as directed. 12/25/17   [provider]    Family History Family History  Problem Relation Age of Onset   Hypertension Mother    Diabetes Maternal Aunt    Breast cancer Other     Social History Social History[1]   Allergies   Empagliflozin   Review of Systems Review of Systems Per HPI  Physical Exam Triage Vital Signs ED Triage Vitals  Encounter Vitals Group     BP 06/05/24 0840 126/73     Girls Systolic BP Percentile --      Girls Diastolic BP Percentile --      Boys Systolic BP Percentile --      Boys Diastolic BP Percentile --      Pulse Rate 06/05/24 0840 77     Resp 06/05/24 0840 20     Temp 06/05/24 0840 97.7 F (36.5 C)     Temp src --      SpO2 06/05/24 0840 96 %     Weight --      Height --      Head Circumference --      Peak Flow --      Pain Score 06/05/24 0836 10     Pain Loc --      Pain Education --      Exclude from Growth Chart --    No data found.  Updated Vital Signs BP 126/73   Pulse 77   Temp 97.7 F (36.5 C)   Resp 20   SpO2 96%   Visual Acuity Right Eye Distance:   Left Eye  Distance:   Bilateral Distance:    Right Eye Near:   Left Eye Near:    Bilateral Near:     Physical Exam Vitals and nursing note reviewed.  Constitutional:      Appearance: She is  not ill-appearing or toxic-appearing.  HENT:     Head: Normocephalic and atraumatic.     Right Ear: Hearing and external ear normal.     Left Ear: Hearing and external ear normal.     Nose: Nose normal.     Mouth/Throat:     Lips: Pink.  Eyes:     General: Lids are normal. Vision grossly intact. Gaze aligned appropriately.     Extraocular Movements: Extraocular movements intact.     Conjunctiva/sclera: Conjunctivae normal.  Cardiovascular:     Rate and Rhythm: Normal rate and regular rhythm.     Heart sounds: Normal heart sounds, S1 normal and S2 normal.  Pulmonary:     Effort: Pulmonary effort is normal. No respiratory distress.     Breath sounds: Normal breath sounds and air entry.  Musculoskeletal:     Cervical back: Neck supple.     Right lower leg: Swelling present. No deformity, lacerations, tenderness (Tenderness to the right calf is not reproducible on palpation.  Patient states this feels deeper than normal.) or bony tenderness. No edema.     Right ankle: Normal.     Right Achilles Tendon: Normal. Thompson's test negative.     Comments: Negative Thompson's test.  Positive Homans' sign to right calf.  +1 popliteal and anterior tibialis pulses of the right leg.  Less than 2 cap refill to right foot.  Normal color and temperature of the right calf/right lower extremity.  No venous stasis dermatitis appearances of the right leg.   Skin:    General: Skin is warm and dry.     Capillary Refill: Capillary refill takes less than 2 seconds.     Findings: No rash.  Neurological:     General: No focal deficit present.     Mental Status: She is alert and oriented to person, place, and time. Mental status is at baseline.     Cranial Nerves: No dysarthria or facial asymmetry.  Psychiatric:        Mood  and Affect: Mood normal.        Speech: Speech normal.        Behavior: Behavior normal.        Thought Content: Thought content normal.        Judgment: Judgment normal.      UC Treatments / Results  Labs (all labs ordered are listed, but only abnormal results are displayed) Labs Reviewed - No data to display  EKG   Radiology No results found.  Procedures Procedures (including critical care time)  Medications Ordered in UC Medications - No data to display  Initial Impression / Assessment and Plan / UC Course  I have reviewed the triage vital signs and the nursing notes.  Pertinent labs & imaging results that were available during my care of the patient were reviewed by me and considered in my medical decision making (see chart for details).   1.  Right calf pain Unclear cause of right calf pain.  High clinical suspicion for muscle spasm and strain secondary to new increased activity over the last 3 months and even worse over the last 3 weeks since the Thanksgiving holiday where patient had to spend a lot of time on her feet. Pain is not reproducible to palpation on exam and feels like a deep ache.  Low suspicion for intermittent claudication/peripheral arterial disease, cellulitis.  Right DVT ultrasound is ordered to rule out emergent causes of patient's right calf pain. Staff will call if DVT ultrasound  is abnormal.  This has been scheduled for 1 PM today on June 05, 2024 [facility].  Lungs clear, low suspicion for PE, non-tachycardic, denies CP/SOB.   As long as DVT study is negative, we will trial treatment with baclofen muscle relaxer 5 mg every 12 hours as needed for muscle spasm.  Drowsiness precautions discussed.  This will be prescribed after her DVT study is negative. Tylenol 1000 mg every 6 hours as needed for calf pain. We discussed use of heat and stretches to alleviate calf pain further. I have encouraged her to go to the nearest  medical supply store and purchase compression stockings to wear while she is working or throughout the day while she is up on her feet.  Counseled patient on potential for adverse effects with medications prescribed/recommended today, strict ER and return-to-clinic precautions discussed, patient verbalized understanding.    Final Clinical Impressions(s) / UC Diagnoses   Final diagnoses:  Right calf pain     Discharge Instructions      Please go to the [facility] at [address] [place], [place] at 1 PM to have a DVT ultrasound performed of your right leg.  I suspect that this ultrasound will be negative for blood clot, however I would like to make sure that you do not have an emergent condition causing your right calf pain.  Staff will call if your ultrasound is abnormal.  Once we get the results of your ultrasound and it is negative, we will treat you with a muscle relaxer called baclofen.  You may take baclofen muscle relaxer 5 mg every 12 hours as needed for muscle spasms and aches/pains to the right calf.  This medication can make you sleepy so do not drink alcohol, drive, or go to work while taking the muscle relaxer (baclofen).  Continue taking Tylenol 1000 mg every 6 hours as needed for pain and inflammation of the right calf.  Use heat and gentle range of motion stretches/exercises to the calf to reduce pain.  Take multiple breaks at work to rest and relieve muscle spasm.  I have provided a work note at the back of your packet for you.  Please schedule a follow-up appointment with your primary care provider and the orthopedic provider listed on your paperwork.   Go to the ER or return to urgent care if you develop worsening swelling of the right calf, redness/warmth to the calf, or shortness of breath or chest pain.     ED Prescriptions   None    PDMP not reviewed this encounter.    [1]  Social History Tobacco Use   Smoking status: Never    Smokeless tobacco: Never  Vaping Use   Vaping status: Never Used  Substance Use Topics   Alcohol use: No   Drug use: No     Enedelia Dorna HERO, FNP 06/05/24 1004

## 2024-06-08 ENCOUNTER — Ambulatory Visit: Admitting: Student-PharmD

## 2024-06-08 ENCOUNTER — Ambulatory Visit (HOSPITAL_COMMUNITY): Payer: Self-pay

## 2024-06-08 ENCOUNTER — Telehealth: Payer: Self-pay

## 2024-06-08 ENCOUNTER — Other Ambulatory Visit (HOSPITAL_COMMUNITY): Payer: Self-pay

## 2024-06-08 ENCOUNTER — Encounter: Payer: Self-pay | Admitting: Student-PharmD

## 2024-06-08 VITALS — BP 128/82 | HR 72

## 2024-06-08 DIAGNOSIS — I82441 Acute embolism and thrombosis of right tibial vein: Secondary | ICD-10-CM

## 2024-06-08 MED ORDER — APIXABAN 5 MG PO TABS
5.0000 mg | ORAL_TABLET | Freq: Two times a day (BID) | ORAL | 0 refills | Status: AC
  Filled 2024-06-08: qty 120, 60d supply, fill #0

## 2024-06-08 NOTE — Patient Instructions (Signed)
-  Continue apixaban  (Eliquis ) 10 mg twice daily for 7 days followed by 5 mg twice daily. -Your refills have been sent to [facility] in our lobby downstairs. Be sure to come back to pick this up before the end of the month.  -It is important to take your medication around the same time every day.  -Avoid NSAIDs like ibuprofen (Advil, Motrin) and naproxen (Aleve) as well as aspirin doses over 100 mg daily. -Tylenol (acetaminophen) is the preferred over the counter pain medication to lower the risk of bleeding. -Be sure to alert all of your health care providers that you are taking an anticoagulant prior to starting a new medication or having a procedure. -Monitor for signs and symptoms of bleeding (abnormal bruising, prolonged bleeding, nose bleeds, bleeding from gums, discolored urine, black tarry stools). If you have fallen and hit your head OR if your bleeding is severe or not stopping, seek emergency care.  -Go to the emergency room if emergent signs and symptoms of new clot occur (new or worse swelling and pain in an arm or leg, shortness of breath, chest pain, fast or irregular heartbeats, lightheadedness, dizziness, fainting, coughing up blood) or if you experience a significant color change (pale or blue) in the extremity that has the DVT.  -We recommend you wear compression stockings (20-30 mmHg) as long as you are having swelling or pain. Be sure to purchase the correct size and take them off at night.   If you have any questions or need to reschedule an appointment, please call [phone]. If you are having an emergency, call 911 or present to the nearest emergency room.   What is a DVT?  -Deep vein thrombosis (DVT) is a condition in which a blood clot forms in a vein of the deep venous system which can occur in the lower leg, thigh, pelvis, arm, or neck. This condition is serious and can be life-threatening if the clot travels to the arteries of the lungs and causing a blockage  (pulmonary embolism, PE). A DVT can also damage veins in the leg, which can lead to long-term venous disease, leg pain, swelling, discoloration, and ulcers or sores (post-thrombotic syndrome).  -Treatment may include taking an anticoagulant medication to prevent more clots from forming and the current clot from growing, wearing compression stockings, and/or surgical procedures to remove or dissolve the clot.

## 2024-06-08 NOTE — Telephone Encounter (Signed)
 Patient Advocate Encounter  Test billing for this patient's current coverage ([employer]) returns a $47 copay for 30 day supply of Eliquis , and a $117.50 copay for 90 day supply.  This test claim was processed through [facility]- copay amounts may vary at other pharmacies due to pharmacy/plan contracts, or as the patient moves through the different stages of their insurance plan.  Rachel DEL, CPhT Rx Patient Advocate Phone: [phone]

## 2024-06-08 NOTE — Progress Notes (Cosign Needed)
 DVT Clinic Note  Name: Kimberly Hammond     MRN: [id number]     DOB: 04/06/1951     Sex: female  PCP: Regino Slater, MD  Today's Visit: Visit Information: Initial Visit  Referred to DVT Clinic by: Urgent Care - Dorna Silk, FNP Referred to CPP by: Dr. Serene Reason for referral:  Chief Complaint  Patient presents with   Med Management - DVT   HISTORY OF PRESENT ILLNESS: Kimberly Hammond is a 73 y.o. female with PMH T2DM, diabetic neuropathy, HTN, who presents after diagnosis of DVT for medication management. Patient presented to urgent care 06/05/24 with right calf pain that started 3-4 weeks prior. She was sent for ultrasound which showed acute DVT in one of the paired right posterior tibial veins. Patient was referred to DVT Clinic to start treatment but declined at the time, so the urgent care provider sent in the Eliquis  starter pack and referred her to our clinic for follow up. No personal history of DVT. Reports history of DVT in her sister who had cancer at the time. Patient is active, working on her feet as a custodian. However in the middle of November, she was sick with a virus and stayed in bed for the better part of a week and not going in to work. Around that same time she had been newly prescribed Jardiance and says this wiped all of her energy so she was already not as active as normal. Symptoms of calf pain started around Thanksgiving. Reports that her swelling and pain have improved. She is taking Eliquis  as prescribed. Denies any adverse effects including abnormal bleeding or bruising.   Positive Thrombotic Risk Factors: Bed rest >72 hours within 3 month, Older Age Bleeding Risk Factors: Age >65 years, Anticoagulant therapy  Negative Thrombotic Risk Factors: Previous VTE, Recent surgery (within 3 months), Recent trauma (within 3 months), Recent admission to hospital with acute illness (within 3 months), Paralysis, paresis, or recent plaster cast immobilization of lower  extremity, Central venous catheterization, Sedentary journey lasting >8 hours within 4 weeks, Pregnancy, Within 6 weeks postpartum, Recent cesarean section (within 3 months), Estrogen therapy, Testosterone therapy, Erythropoiesis-stimulating agent, Recent COVID diagnosis (within 3 months), Active cancer, Non-malignant, chronic inflammatory condition, Known thrombophilic condition, Smoking, Obesity  Rx Insurance Coverage: Medicare Rx Affordability: Eliquis  is $47/month.  Rx Assistance Provided: Free 30-day trial card Preferred Pharmacy: Filled starter pack with free card at [facility]. Refills today sent to [facility] to complete 3 months.   Past Medical History:  Diagnosis Date   Cataract    NS OU   Diabetes mellitus without complication (HCC)    Diabetic retinopathy (HCC)    NPDR OU   Hypertension    Hypertensive retinopathy    OU    Past Surgical History:  Procedure Laterality Date   EYE SURGERY  2015   OS/OD   HYSTERECTOMY ABDOMINAL WITH SALPINGECTOMY      Social History   Socioeconomic History   Marital status: Widowed    Spouse name: Not on file   Number of children: Not on file   Years of education: Not on file   Highest education level: Not on file  Occupational History   Not on file  Tobacco Use   Smoking status: Never   Smokeless tobacco: Never  Vaping Use   Vaping status: Never Used  Substance and Sexual Activity   Alcohol use: No   Drug use: No   Sexual activity: Not on file  Other  Topics Concern   Not on file  Social History Narrative   Not on file   Social Drivers of Health   Tobacco Use: Low Risk (06/08/2024)   Patient History    Smoking Tobacco Use: Never    Smokeless Tobacco Use: Never    Passive Exposure: Not on file  Financial Resource Strain: Not on file  Food Insecurity: Not on file  Transportation Needs: Not on file  Physical Activity: Not on file  Stress: Not on file  Social Connections: Not on file  Intimate Partner Violence: Not on  file  Depression (EYV7-0): Not on file  Alcohol Screen: Not on file  Housing: Not on file  Utilities: Not on file  Health Literacy: Not on file    Family History  Problem Relation Age of Onset   Hypertension Mother    Diabetes Maternal Aunt    Breast cancer Other     Allergies as of 06/08/2024 - Review Complete 06/08/2024  Allergen Reaction Noted   Empagliflozin Other (See Comments) 06/05/2024    Medications Ordered Prior to Encounter[1] REVIEW OF SYSTEMS:  Review of Systems  Respiratory:  Negative for shortness of breath.   Cardiovascular:  Positive for leg swelling. Negative for chest pain and palpitations.  Musculoskeletal:  Negative for myalgias.  Neurological:  Negative for dizziness and tingling.   PHYSICAL EXAMINATION:  Vitals:   06/08/24 0902  BP: 128/82  Pulse: 72  SpO2: 100%    Physical Exam Vitals reviewed.  Cardiovascular:     Rate and Rhythm: Normal rate.  Pulmonary:     Effort: Pulmonary effort is normal.  Musculoskeletal:        General: No tenderness.     Right lower leg: Edema (1+) present.     Left lower leg: Edema (trace) present.  Skin:    Findings: No bruising or erythema.  Psychiatric:        Mood and Affect: Mood normal.        Behavior: Behavior normal.        Thought Content: Thought content normal.   Villalta Score for Post-Thrombotic Syndrome: Pain: Absent Cramps: Absent Heaviness: Absent Paresthesia: Absent Pruritus: Absent Pretibial Edema: Mild Skin Induration: Absent Hyperpigmentation: Absent Redness: Absent Venous Ectasia: Absent Pain on calf compression: Absent Villalta Preliminary Score: 1 Is venous ulcer present?: No If venous ulcer is present and score is <15, then 15 points total are assigned: Absent Villalta Total Score: 1  LABS:  CBC     Component Value Date/Time   WBC 4.9 05/11/2017 1055   RBC 5.03 05/11/2017 1055   HGB 11.3 (L) 05/11/2017 1055   HCT 35.5 (L) 05/11/2017 1055   PLT 231 05/11/2017 1055    MCV 70.6 (L) 05/11/2017 1055   MCH 22.5 (L) 05/11/2017 1055   MCHC 31.8 05/11/2017 1055   RDW 13.6 05/11/2017 1055    Hepatic Function   No results found for: PROT, ALBUMIN, AST, ALT, ALKPHOS, BILITOT, BILIDIR, IBILI  Renal Function   Lab Results  Component Value Date   CREATININE 0.62 05/11/2017    CrCl cannot be calculated (Patient's most recent lab result is older than the maximum 21 days allowed.).   Labs in [facility]:  04/07/24: eGFR 55 08/01/23: Hgb 11.4, Hct 36.4  [facility] Vascular Lab Studies:  06/05/24 VAS US  LOWER EXTREMITY VENOUS (DVT)RIGHT  Summary:  RIGHT:  - Findings consistent with acute deep vein thrombosis involving one of the  paired proximal posterior tibial veins. The mid posterior tibial vein  thrombus  appears age indeterminate. The distal segment of the posterior  tibial vein is patent and compressible.    LEFT:  - No evidence of common femoral vein obstruction.   ASSESSMENT: Location of DVT: Right distal vein Cause of DVT: provoked by a transient risk factor  Patient without prior history of DVT diagnosed with acute DVT involving one of the paired proximal posterior tibial veins. She was started on Eliquis  by urgent care on 06/05/24 and is tolerating this well. DVT provoked by acute decline in activity with bed rest caused by viral respiratory illness and adverse reactions to Jardiance around the same time, which immediately preceded her symptom onset. Therefore, will plan to treat her first provoked DVT with 3 months of anticoagulation. Provided refills to complete this treatment and counseled patient extensively on Eliquis . No barriers identified today to medication adherence or access. Her symptoms are already improving. No role for repeating imaging at end of treatment. All questions have been answered.   PLAN: -Continue apixaban  (Eliquis ) 10 mg twice daily for 7 days followed by 5 mg twice daily. -Expected duration of therapy: 3 months. Therapy  started on 06/05/24. -Patient educated on purpose, proper use and potential adverse effects of apixaban  (Eliquis ). -Discussed importance of taking medication around the same time every day. -Advised patient of medications to avoid (NSAIDs, aspirin doses >100 mg daily). -Educated that Tylenol (acetaminophen) is the preferred analgesic to lower the risk of bleeding. -Advised patient to alert all providers of anticoagulation therapy prior to starting a new medication or having a procedure. -Emphasized importance of monitoring for signs and symptoms of bleeding (abnormal bruising, prolonged bleeding, nose bleeds, bleeding from gums, discolored urine, black tarry stools). -Educated patient to present to the ED if emergent signs and symptoms of new thrombosis occur. -Counseled patient to wear compression stockings daily, removing at night.  Follow up: 3 months in DVT Clinic  Lum Herald, PharmD, [place], CPP [facility] [phone]  I have evaluated the patient's chart/imaging and refer this patient to the Clinical Pharmacist Practitioner for medication management. I have reviewed the CPP's documentation and agree with her assessment and plan. I was immediately available during the visit for questions and collaboration.   Malvina New, MD      [1]  Current Outpatient Medications on File Prior to Visit  Medication Sig Dispense Refill   APIXABAN  (ELIQUIS ) VTE STARTER PACK (10MG  AND 5MG ) Take as directed on package: start with two-5mg  tablets twice daily for 7 days. On day 8, switch to one-5mg  tablet twice daily. 74 each 0   aspirin 81 MG chewable tablet Chew 81 mg by mouth daily.     atorvastatin (LIPITOR) 10 MG tablet Take 10 mg by mouth daily.  2   glipiZIDE (GLUCOTROL XL) 5 MG 24 hr tablet TAKE 1 TABLET BY MOUTH TWICE DAILY FOR 90 DAYS (Patient taking differently: Take 5 mg by mouth 2 (two) times daily.)     insulin  glargine (LANTUS ) 100 UNIT/ML  injection Inject 0.16 mLs (16 Units total) into the skin daily. 10 mL 5   lisinopril-hydrochlorothiazide (PRINZIDE,ZESTORETIC) 20-25 MG tablet Take 1 tablet by mouth daily.     metFORMIN (GLUCOPHAGE) 500 MG tablet Take 1,000 mg by mouth 2 (two) times daily with a meal. (Patient taking differently: Take 1,000 mg by mouth daily with breakfast.)     metoprolol succinate (TOPROL-XL) 25 MG 24 hr tablet Take 25 mg by mouth daily.     Nutritional Supplements (VITAMIN D MAINTENANCE  PO) Take 1 capsule by mouth daily.     Potassium 99 MG TABS Take 1 tablet by mouth daily.     insulin  glargine (LANTUS ) 100 UNIT/ML injection Inject 16 units into the skin once daily. Vial expires 28 days after opening 10 mL 5   ONETOUCH VERIO test strip as directed.  12   RELION INSULIN  SYR 0.5ML/31G 31G X 5/16 0.5 ML MISC as directed.  0   No current facility-administered medications on file prior to visit.

## 2024-06-29 ENCOUNTER — Other Ambulatory Visit (HOSPITAL_COMMUNITY): Payer: Self-pay

## 2024-07-03 ENCOUNTER — Other Ambulatory Visit (HOSPITAL_COMMUNITY): Payer: Self-pay

## 2024-07-06 ENCOUNTER — Other Ambulatory Visit (HOSPITAL_COMMUNITY): Payer: Self-pay

## 2024-07-08 ENCOUNTER — Other Ambulatory Visit (HOSPITAL_COMMUNITY): Payer: Self-pay

## 2024-07-13 ENCOUNTER — Other Ambulatory Visit (HOSPITAL_COMMUNITY): Payer: Self-pay

## 2024-07-14 ENCOUNTER — Other Ambulatory Visit (HOSPITAL_COMMUNITY): Payer: Self-pay

## 2024-07-17 ENCOUNTER — Other Ambulatory Visit (HOSPITAL_COMMUNITY): Payer: Self-pay

## 2024-07-20 ENCOUNTER — Other Ambulatory Visit (HOSPITAL_COMMUNITY): Payer: Self-pay

## 2024-07-27 ENCOUNTER — Other Ambulatory Visit (HOSPITAL_COMMUNITY): Payer: Self-pay

## 2024-07-28 ENCOUNTER — Other Ambulatory Visit (HOSPITAL_COMMUNITY): Payer: Self-pay

## 2024-07-30 ENCOUNTER — Other Ambulatory Visit (HOSPITAL_COMMUNITY): Payer: Self-pay

## 2024-07-31 ENCOUNTER — Other Ambulatory Visit: Payer: Self-pay | Admitting: Student-PharmD

## 2024-09-01 ENCOUNTER — Ambulatory Visit: Admitting: Student-PharmD

## 2024-09-02 ENCOUNTER — Ambulatory Visit: Admitting: Student-PharmD

## 2024-09-28 ENCOUNTER — Encounter (INDEPENDENT_AMBULATORY_CARE_PROVIDER_SITE_OTHER): Admitting: Ophthalmology
